# Patient Record
Sex: Female | Born: 1935
Health system: Southern US, Community
[De-identification: ages and names within clinical notes are randomized; demographics above are authoritative.]

## PROBLEM LIST (undated history)

## (undated) ENCOUNTER — Emergency Department (HOSPITAL_BASED_OUTPATIENT_CLINIC_OR_DEPARTMENT_OTHER): Payer: Medicare HMO

## (undated) DIAGNOSIS — M720 Palmar fascial fibromatosis [Dupuytren]: Secondary | ICD-10-CM

## (undated) DIAGNOSIS — Z98811 Dental restoration status: Secondary | ICD-10-CM

## (undated) DIAGNOSIS — I10 Essential (primary) hypertension: Secondary | ICD-10-CM

## (undated) DIAGNOSIS — E785 Hyperlipidemia, unspecified: Secondary | ICD-10-CM

## (undated) HISTORY — DX: Essential (primary) hypertension: I10

## (undated) HISTORY — PX: CATARACT EXTRACTION W/ INTRAOCULAR LENS  IMPLANT, BILATERAL: SHX1307

## (undated) HISTORY — DX: Hyperlipidemia, unspecified: E78.5

---

## 1999-08-05 ENCOUNTER — Encounter: Admission: RE | Admit: 1999-08-05 | Discharge: 1999-08-05 | Payer: Self-pay | Admitting: Obstetrics and Gynecology

## 1999-08-05 ENCOUNTER — Encounter: Payer: Self-pay | Admitting: Obstetrics and Gynecology

## 2000-08-10 ENCOUNTER — Encounter: Admission: RE | Admit: 2000-08-10 | Discharge: 2000-08-10 | Payer: Self-pay | Admitting: Obstetrics and Gynecology

## 2000-08-10 ENCOUNTER — Encounter: Payer: Self-pay | Admitting: Obstetrics and Gynecology

## 2003-09-14 ENCOUNTER — Encounter: Admission: RE | Admit: 2003-09-14 | Discharge: 2003-09-14 | Payer: Self-pay | Admitting: Internal Medicine

## 2003-10-15 ENCOUNTER — Encounter: Admission: RE | Admit: 2003-10-15 | Discharge: 2003-10-15 | Payer: Self-pay | Admitting: Internal Medicine

## 2003-11-26 ENCOUNTER — Encounter: Admission: RE | Admit: 2003-11-26 | Discharge: 2003-11-26 | Payer: Self-pay | Admitting: Internal Medicine

## 2003-12-01 ENCOUNTER — Encounter: Payer: Self-pay | Admitting: Internal Medicine

## 2003-12-22 ENCOUNTER — Encounter: Payer: Self-pay | Admitting: Internal Medicine

## 2004-01-02 ENCOUNTER — Encounter: Payer: Self-pay | Admitting: Internal Medicine

## 2004-09-14 ENCOUNTER — Ambulatory Visit: Payer: Self-pay | Admitting: Internal Medicine

## 2004-10-24 ENCOUNTER — Ambulatory Visit: Payer: Self-pay | Admitting: Internal Medicine

## 2004-11-28 ENCOUNTER — Encounter: Payer: Self-pay | Admitting: Internal Medicine

## 2005-01-24 ENCOUNTER — Ambulatory Visit: Payer: Self-pay | Admitting: Internal Medicine

## 2005-02-20 ENCOUNTER — Ambulatory Visit: Payer: Self-pay | Admitting: Internal Medicine

## 2006-04-16 ENCOUNTER — Ambulatory Visit: Payer: Self-pay | Admitting: Internal Medicine

## 2006-04-16 LAB — CONVERTED CEMR LAB
ALT: 8 units/L (ref 0–40)
AST: 16 units/L (ref 0–37)
Calcium: 9.5 mg/dL (ref 8.4–10.5)
Chloride: 105 meq/L (ref 96–112)
Chol/HDL Ratio, serum: 3.1
Cholesterol: 203 mg/dL (ref 0–200)
GFR calc non Af Amer: 106 mL/min
Glucose, Bld: 87 mg/dL (ref 70–99)
Hemoglobin: 14.9 g/dL (ref 12.0–15.0)
LDL DIRECT: 129.1 mg/dL
RBC: 4.45 M/uL (ref 3.87–5.11)
RDW: 11.2 % — ABNORMAL LOW (ref 11.5–14.6)
TSH: 2.07 microintl units/mL (ref 0.35–5.50)
VLDL: 12 mg/dL (ref 0–40)

## 2006-04-30 ENCOUNTER — Ambulatory Visit: Payer: Self-pay | Admitting: Internal Medicine

## 2006-04-30 LAB — CONVERTED CEMR LAB
GFR calc non Af Amer: 88 mL/min
Glomerular Filtration Rate, Af Am: 107 mL/min/{1.73_m2}
Potassium: 4.2 meq/L (ref 3.5–5.1)
Sodium: 142 meq/L (ref 135–145)

## 2006-09-07 ENCOUNTER — Encounter: Payer: Self-pay | Admitting: Internal Medicine

## 2006-09-07 ENCOUNTER — Encounter: Admission: RE | Admit: 2006-09-07 | Discharge: 2006-09-07 | Payer: Self-pay | Admitting: Internal Medicine

## 2006-09-07 ENCOUNTER — Ambulatory Visit: Payer: Self-pay | Admitting: Internal Medicine

## 2006-09-07 DIAGNOSIS — Z860100 Personal history of colon polyps, unspecified: Secondary | ICD-10-CM | POA: Insufficient documentation

## 2006-09-07 DIAGNOSIS — I10 Essential (primary) hypertension: Secondary | ICD-10-CM | POA: Insufficient documentation

## 2006-09-07 DIAGNOSIS — J439 Emphysema, unspecified: Secondary | ICD-10-CM | POA: Insufficient documentation

## 2006-09-07 DIAGNOSIS — M810 Age-related osteoporosis without current pathological fracture: Secondary | ICD-10-CM | POA: Insufficient documentation

## 2006-09-07 DIAGNOSIS — E785 Hyperlipidemia, unspecified: Secondary | ICD-10-CM | POA: Insufficient documentation

## 2007-05-20 ENCOUNTER — Ambulatory Visit: Payer: Self-pay | Admitting: Internal Medicine

## 2007-09-12 ENCOUNTER — Ambulatory Visit: Payer: Self-pay | Admitting: Internal Medicine

## 2007-09-13 LAB — CONVERTED CEMR LAB
ALT: 9 units/L (ref 0–35)
AST: 21 units/L (ref 0–37)
Basophils Absolute: 0 10*3/uL (ref 0.0–0.1)
Calcium: 10 mg/dL (ref 8.4–10.5)
Cholesterol: 269 mg/dL (ref 0–200)
GFR calc Af Amer: 107 mL/min
GFR calc non Af Amer: 88 mL/min
Glucose, Bld: 94 mg/dL (ref 70–99)
HCT: 41.7 % (ref 36.0–46.0)
HDL: 79.1 mg/dL (ref >39.0)
LDL Cholesterol: 172 mg/dL — ABNORMAL HIGH (ref 0–99)
MCHC: 33.9 g/dL (ref 30.0–36.0)
Monocytes Absolute: 0.4 10*3/uL (ref 0.1–1.0)
Monocytes Relative: 9.9 % (ref 3.0–12.0)
Neutro Abs: 2.1 10*3/uL (ref 1.4–7.7)
Platelets: 291 10*3/uL (ref 150–400)
Potassium: 4.4 meq/L (ref 3.5–5.1)
RDW: 11.9 % (ref 11.5–14.6)
Sodium: 140 meq/L (ref 135–145)
TSH: 2.88 microintl units/mL (ref 0.35–5.50)
Total CHOL/HDL Ratio: 3.4
Triglycerides: 92 mg/dL (ref 0–149)

## 2007-09-15 ENCOUNTER — Encounter (INDEPENDENT_AMBULATORY_CARE_PROVIDER_SITE_OTHER): Payer: Self-pay | Admitting: *Deleted

## 2007-09-20 ENCOUNTER — Encounter (INDEPENDENT_AMBULATORY_CARE_PROVIDER_SITE_OTHER): Payer: Self-pay | Admitting: *Deleted

## 2007-10-16 ENCOUNTER — Ambulatory Visit: Payer: Self-pay | Admitting: Internal Medicine

## 2007-10-16 ENCOUNTER — Encounter: Payer: Self-pay | Admitting: Internal Medicine

## 2007-10-28 ENCOUNTER — Telehealth (INDEPENDENT_AMBULATORY_CARE_PROVIDER_SITE_OTHER): Payer: Self-pay | Admitting: *Deleted

## 2007-11-06 ENCOUNTER — Ambulatory Visit: Payer: Self-pay | Admitting: Internal Medicine

## 2007-11-06 ENCOUNTER — Encounter (INDEPENDENT_AMBULATORY_CARE_PROVIDER_SITE_OTHER): Payer: Self-pay | Admitting: *Deleted

## 2007-11-19 ENCOUNTER — Encounter: Payer: Self-pay | Admitting: Internal Medicine

## 2007-11-29 ENCOUNTER — Telehealth (INDEPENDENT_AMBULATORY_CARE_PROVIDER_SITE_OTHER): Payer: Self-pay | Admitting: *Deleted

## 2008-01-09 ENCOUNTER — Encounter (INDEPENDENT_AMBULATORY_CARE_PROVIDER_SITE_OTHER): Payer: Self-pay | Admitting: *Deleted

## 2008-03-23 ENCOUNTER — Encounter: Payer: Self-pay | Admitting: Internal Medicine

## 2008-03-24 ENCOUNTER — Telehealth (INDEPENDENT_AMBULATORY_CARE_PROVIDER_SITE_OTHER): Payer: Self-pay | Admitting: *Deleted

## 2008-04-08 ENCOUNTER — Ambulatory Visit: Payer: Self-pay | Admitting: Internal Medicine

## 2008-04-14 ENCOUNTER — Telehealth (INDEPENDENT_AMBULATORY_CARE_PROVIDER_SITE_OTHER): Payer: Self-pay | Admitting: *Deleted

## 2008-04-14 LAB — CONVERTED CEMR LAB
AST: 20 units/L (ref 0–37)
Creatinine, Ser: 0.7 mg/dL (ref 0.4–1.2)
HDL: 90.3 mg/dL (ref >39.0)
Potassium: 5 meq/L (ref 3.5–5.1)
Total CHOL/HDL Ratio: 2.8
VLDL: 11 mg/dL (ref 0–40)
Vitamin B-12: 862 pg/mL (ref 211–911)

## 2008-05-04 ENCOUNTER — Telehealth (INDEPENDENT_AMBULATORY_CARE_PROVIDER_SITE_OTHER): Payer: Self-pay | Admitting: *Deleted

## 2008-05-18 ENCOUNTER — Encounter (INDEPENDENT_AMBULATORY_CARE_PROVIDER_SITE_OTHER): Payer: Self-pay | Admitting: *Deleted

## 2008-06-09 ENCOUNTER — Ambulatory Visit: Payer: Self-pay | Admitting: Internal Medicine

## 2009-04-19 ENCOUNTER — Encounter (INDEPENDENT_AMBULATORY_CARE_PROVIDER_SITE_OTHER): Payer: Self-pay | Admitting: *Deleted

## 2009-05-03 ENCOUNTER — Telehealth (INDEPENDENT_AMBULATORY_CARE_PROVIDER_SITE_OTHER): Payer: Self-pay | Admitting: *Deleted

## 2009-05-31 ENCOUNTER — Ambulatory Visit: Payer: Self-pay | Admitting: Internal Medicine

## 2009-05-31 LAB — CONVERTED CEMR LAB: Vit D, 25-Hydroxy: 47 ng/mL (ref 30–89)

## 2009-06-01 LAB — CONVERTED CEMR LAB
ALT: 9 units/L (ref 0–35)
BUN: 11 mg/dL (ref 6–23)
Basophils Absolute: 0 10*3/uL (ref 0.0–0.1)
CO2: 32 meq/L (ref 19–32)
Chloride: 99 meq/L (ref 96–112)
Cholesterol: 254 mg/dL — ABNORMAL HIGH (ref 0–200)
Creatinine, Ser: 0.7 mg/dL (ref 0.4–1.2)
Direct LDL: 154.8 mg/dL
Eosinophils Relative: 2.5 % (ref 0.0–5.0)
HCT: 39.9 % (ref 36.0–46.0)
Hemoglobin: 13.6 g/dL (ref 12.0–15.0)
Lymphocytes Relative: 34.9 % (ref 12.0–46.0)
Lymphs Abs: 1.7 10*3/uL (ref 0.7–4.0)
Monocytes Relative: 11.6 % (ref 3.0–12.0)
Neutro Abs: 2.6 10*3/uL (ref 1.4–7.7)
Platelets: 258 10*3/uL (ref 150.0–400.0)
Potassium: 4.5 meq/L (ref 3.5–5.1)
RDW: 11.8 % (ref 11.5–14.6)
TSH: 3.97 microintl units/mL (ref 0.35–5.50)
Total CHOL/HDL Ratio: 3
VLDL: 16 mg/dL (ref 0.0–40.0)
WBC: 5 10*3/uL (ref 4.5–10.5)

## 2009-06-08 ENCOUNTER — Telehealth (INDEPENDENT_AMBULATORY_CARE_PROVIDER_SITE_OTHER): Payer: Self-pay | Admitting: *Deleted

## 2009-06-15 ENCOUNTER — Ambulatory Visit: Payer: Self-pay | Admitting: Internal Medicine

## 2009-06-15 DIAGNOSIS — F17200 Nicotine dependence, unspecified, uncomplicated: Secondary | ICD-10-CM | POA: Insufficient documentation

## 2009-06-16 ENCOUNTER — Encounter (INDEPENDENT_AMBULATORY_CARE_PROVIDER_SITE_OTHER): Payer: Self-pay | Admitting: *Deleted

## 2009-07-20 ENCOUNTER — Telehealth: Payer: Self-pay | Admitting: Internal Medicine

## 2009-07-23 ENCOUNTER — Encounter (INDEPENDENT_AMBULATORY_CARE_PROVIDER_SITE_OTHER): Payer: Self-pay | Admitting: *Deleted

## 2009-08-19 ENCOUNTER — Encounter (INDEPENDENT_AMBULATORY_CARE_PROVIDER_SITE_OTHER): Payer: Self-pay | Admitting: *Deleted

## 2009-08-25 ENCOUNTER — Telehealth: Payer: Self-pay | Admitting: Internal Medicine

## 2009-08-26 ENCOUNTER — Ambulatory Visit: Payer: Self-pay | Admitting: Internal Medicine

## 2009-09-06 ENCOUNTER — Ambulatory Visit: Payer: Self-pay | Admitting: Internal Medicine

## 2009-09-06 LAB — HM COLONOSCOPY

## 2009-09-08 ENCOUNTER — Encounter: Payer: Self-pay | Admitting: Internal Medicine

## 2009-09-11 ENCOUNTER — Encounter: Payer: Self-pay | Admitting: Internal Medicine

## 2009-09-24 ENCOUNTER — Telehealth: Payer: Self-pay | Admitting: Internal Medicine

## 2009-12-23 ENCOUNTER — Telehealth (INDEPENDENT_AMBULATORY_CARE_PROVIDER_SITE_OTHER): Payer: Self-pay | Admitting: *Deleted

## 2010-02-16 ENCOUNTER — Ambulatory Visit: Payer: Self-pay | Admitting: Family Medicine

## 2010-02-16 ENCOUNTER — Encounter: Payer: Self-pay | Admitting: Internal Medicine

## 2010-06-19 LAB — CONVERTED CEMR LAB
ALT: 10 units/L (ref 0–40)
Basophils Absolute: 0 10*3/uL (ref 0.0–0.1)
Cholesterol: 236 mg/dL (ref 0–200)
Hemoglobin: 13.8 g/dL (ref 12.0–15.0)
Lymphocytes Relative: 33.3 % (ref 12.0–46.0)
MCHC: 34.8 g/dL (ref 30.0–36.0)
Monocytes Absolute: 0.4 10*3/uL (ref 0.2–0.7)
Monocytes Relative: 10.5 % (ref 3.0–11.0)
Neutro Abs: 2.3 10*3/uL (ref 1.4–7.7)
Neutrophils Relative %: 53 % (ref 43.0–77.0)
Potassium: 4.5 meq/L (ref 3.5–5.1)

## 2010-06-21 NOTE — Letter (Signed)
Summary: [facility]   Imported By: Lester [place] 09/14/2009 09:12:49  _____________________________________________________________________  External Attachment:    Type:   Image     Comment:   External Document

## 2010-06-21 NOTE — Letter (Signed)
Summary: Previsit letter  [facility]  [address] [place], [place] [address]   Phone: [phone]  Fax: [phone]       07/23/2009 MRN: [id number]  [facility] [address] [place], [place]  [address]  Dear Christie Peters,  Welcome to the Gastroenterology Division at [facility].    You are scheduled to see a nurse for your pre-procedure visit on 4-4-11at 9am on the 3rd floor at [facility], [address]  We ask that you try to arrive at our office 15 minutes prior to your appointment time to allow for check-in.  Your nurse visit will consist of discussing your medical and surgical history, your immediate family medical history, and your medications.    Please bring a complete list of all your medications or, if you prefer, bring the medication bottles and we will list them.  We will need to be aware of both prescribed and over the counter drugs.  We will need to know exact dosage information as well.  If you are on blood thinners (Coumadin, Plavix, Aggrenox, Ticlid, etc.) please call our office today/prior to your appointment, as we need to consult with your physician about holding your medication.   Please be prepared to read and sign documents such as consent forms, a financial agreement, and acknowledgement forms.  If necessary, and with your consent, a friend or relative is welcome to sit-in on the nurse visit with you.  Please bring your insurance card so that we may make a copy of it.  If your insurance requires a referral to see a specialist, please bring your referral form from your primary care physician.  No co-pay is required for this nurse visit.     If you cannot keep your appointment, please call [phone] to cancel or reschedule prior to your appointment date.  This allows [place] the opportunity to schedule an appointment for another patient in need of care.    Thank you for choosing [facility] for your medical needs.  We  appreciate the opportunity to care for you.  Please visit [place] at our website  to learn more about our practice.                     Sincerely.                                                                                                                   The Gastroenterology Division

## 2010-06-21 NOTE — Progress Notes (Signed)
Summary: fyi - change from actonel to fosamax  Phone Note Call from Patient Call back at [facility] Phone [phone]   Summary of Call: Insurance is requesting pt change from actonel to generic fosamax  Initial call taken by: Shary Decamp,  Sep 24, 2009 2:11 PM  Follow-up for Phone Call        ok as long as patient agreable Elsworth Ledin E. Arzella Rehmann MD  Sep 24, 2009 3:55 PM  yes, I spoke with pt Shary Decamp  Sep 24, 2009 4:00 PM     New/Updated Medications: ALENDRONATE SODIUM 70 MG TABS (ALENDRONATE SODIUM) 1 by mouth qwk Prescriptions: ALENDRONATE SODIUM 70 MG TABS (ALENDRONATE SODIUM) 1 by mouth qwk  #4 x 11   Entered by:   Shary Decamp   Authorized by:   Nolon Rod. Layne Dilauro MD   Signed by:   Shary Decamp on 09/24/2009   Method used:   Electronically to        [facility]  [phone]* (retail)       [address]       [place], [place]  [address]       Ph: [phone] or [phone]       Fax: [phone]   RxID:   [id number]

## 2010-06-21 NOTE — Letter (Signed)
Summary: [facility] Instructions  [facility]  [address] [place], [place] [address]   Phone: [phone]  Fax: [phone]       Christie Peters    [birth year]    MRN: [id number]        Procedure Day /Date:  Monday  09/06/2009     Arrival Time:  8:00 am      Procedure Time:  9:00 am     Location of Procedure:                    _x _  [facility] (4th Floor)                        PREPARATION FOR COLONOSCOPY WITH MOVIPREP   Starting 5 days prior to your procedure _ 4/13/2011_ do not eat nuts, seeds, popcorn, corn, beans, peas,  salads, or any raw vegetables.  Do not take any fiber supplements (e.g. Metamucil, Citrucel, and Benefiber).  THE DAY BEFORE YOUR PROCEDURE         DATE:  09/05/2009  DAY:  Sunday  1.  Drink clear liquids the entire day-NO SOLID FOOD  2.  Do not drink anything colored red or purple.  Avoid juices with pulp.  No orange juice.  3.  Drink at least 64 oz. (8 glasses) of fluid/clear liquids during the day to prevent dehydration and help the prep work efficiently.  CLEAR LIQUIDS INCLUDE: Water Jello Ice Popsicles Tea (sugar ok, no milk/cream) Powdered fruit flavored drinks Coffee (sugar ok, no milk/cream) Gatorade Juice: apple, white grape, white cranberry  Lemonade Clear bullion, consomm, broth Carbonated beverages (any kind) Strained chicken noodle soup Hard Candy                             4.  In the morning, mix first dose of MoviPrep solution:    Empty 1 Pouch A and 1 Pouch B into the disposable container    Add lukewarm drinking water to the top line of the container. Mix to dissolve    Refrigerate (mixed solution should be used within 24 hrs)  5.  Begin drinking the prep at 5:00 p.m. The MoviPrep container is divided by 4 marks.   Every 15 minutes drink the solution down to the next mark (approximately 8 oz) until the full liter is complete.   6.  Follow completed prep with 16 oz of clear liquid of your  choice (Nothing red or purple).  Continue to drink clear liquids until bedtime.  7.  Before going to bed, mix second dose of MoviPrep solution:    Empty 1 Pouch A and 1 Pouch B into the disposable container    Add lukewarm drinking water to the top line of the container. Mix to dissolve    Refrigerate  THE DAY OF YOUR PROCEDURE      DATE:  09/06/2009   DAY:    Monday  Beginning at 4:00a.m. (5 hours before procedure):         1. Every 15 minutes, drink the solution down to the next mark (approx 8 oz) until the full liter is complete.  2. Follow completed prep with 16 oz. of clear liquid of your choice.    3. You may drink clear liquids until 7:00am (2 HOURS BEFORE PROCEDURE).   MEDICATION INSTRUCTIONS     Additional medication instructions: Take only your Benazipril the morning  of your procedure.         OTHER INSTRUCTIONS  You will need a responsible adult at least 73 years of age to accompany you and drive you home.   This person must remain in the waiting room during your procedure.  Wear loose fitting clothing that is easily removed.  Leave jewelry and other valuables at home.  However, you may wish to bring a book to read or  an iPod/MP3 player to listen to music as you wait for your procedure to start.  Remove all body piercing jewelry and leave at home.  Total time from sign-in until discharge is approximately 2-3 hours.  You should go home directly after your procedure and rest.  You can resume normal activities the  day after your procedure.  The day of your procedure you should not:   Drive   Make legal decisions   Operate machinery   Drink alcohol   Return to work  You will receive specific instructions about eating, activities and medications before you leave.    The above instructions have been reviewed and explained to me by   Wyona Almas RN  April  73, 2011 10:56 AM     I fully understand and can verbalize these instructions  _____________________________ Date _________

## 2010-06-21 NOTE — Letter (Signed)
Summary: [facility]   Imported By: Lester Gruver 09/14/2009 09:15:04  _____________________________________________________________________  External Attachment:    Type:   Image     Comment:   External Document

## 2010-06-21 NOTE — Progress Notes (Signed)
Summary: refill  Phone Note Refill Request Message from:  Fax from Pharmacy on December 23, 2009 9:05 AM  Refills Requested: Medication #1:  HYDROCHLOROTHIAZIDE 25 MG TABS Take 1 tablet by mouth once a day  Medication #2:  BENAZEPRIL HCL 20 MG  TABS 2 by mouth qd [facility] - fax [phone]  Initial call taken by: Okey Regal [place],  December 23, 2009 9:06 AM    Prescriptions: BENAZEPRIL HCL 20 MG  TABS (BENAZEPRIL HCL) 2 by mouth qd  #60 Each x 6   Entered by:   Army Fossa CMA   Authorized by:   Nolon Rod. Paz MD   Signed by:   Army Fossa CMA on 12/23/2009   Method used:   Electronically to        [facility]  [phone]* (retail)       [address]       [place], [place]  [address]       Ph: [phone] or [phone]       Fax: [phone]   RxID:   [id number] HYDROCHLOROTHIAZIDE 25 MG TABS (HYDROCHLOROTHIAZIDE) Take 1 tablet by mouth once a day  #30 Each x 6   Entered by:   Army Fossa CMA   Authorized by:   Nolon Rod. Paz MD   Signed by:   Army Fossa CMA on 12/23/2009   Method used:   Electronically to        [facility]  [phone]* (retail)       [address]       [place], [place]  [address]       Ph: [phone] or [phone]       Fax: [phone]   RxID:   [id number]

## 2010-06-21 NOTE — Letter (Signed)
Summary: Primary Care Consult Scheduled Letter  [facility]  [address] [place], [place] [address]   Phone: [phone]  Fax: [phone]      06/16/2009 MRN: [id number]  [facility] [address] [place], [place]  [address]    Dear Mrs. Record,    We have scheduled an appointment for you.  At the recommendation of Dr. Willow Ora, we have scheduled you for a Screening Mammogram with [facility] on 09-06-2009 at 9:00am.  Their address is [address], [address], [place] [address]. The office phone number is [phone].  If this appointment day and time is not convenient for you, please feel free to call the office of the doctor you are being referred to at the number listed above and reschedule the appointment.    It is important for you to keep your scheduled appointments. We are here to make sure you are given good patient care.   Thank you,    Renee, Patient Care Coordinator [facility]

## 2010-06-21 NOTE — Procedures (Signed)
Summary: Colonoscopy  Patient: Christie Peters Note: All result statuses are Final unless otherwise noted.  Tests: (1) Colonoscopy (COL)   COL Colonoscopy           DONE     [facility]     [address]     [place], [place]  [address]           COLONOSCOPY PROCEDURE REPORT           PATIENT:  Christie Peters, Christie Peters  MR#:  [id number]     BIRTHDATE:  Sep 11, 1935, 73 yrs. old  GENDER:  female     ENDOSCOPIST:  Iva Boop, MD, [facility]     REF. BY:  Willow Ora, M.D.     PROCEDURE DATE:  09/06/2009     PROCEDURE:  Colonoscopy with biopsy and snare polypectomy     ASA CLASS:  Class II     INDICATIONS:  surveillance and high-risk screening, history of     polyps patient reported colon polypectomy about 5 years ago     ([place])     MEDICATIONS:   Fentanyl 75 mcg IV, Versed 7 mg IV           DESCRIPTION OF PROCEDURE:   After the risks benefits and     alternatives of the procedure were thoroughly explained, informed     consent was obtained.  Digital rectal exam was performed and     revealed no abnormalities.   The LB [id number]-[id number] endoscope     was introduced through the anus and advanced to the cecum, which     was identified by both the appendix and ileocecal valve, without     limitations.  The quality of the prep was excellent, using     MoviPrep.  The instrument was then slowly withdrawn as the colon     was fully examined. Insertion: 7:16 minutes Withdrawal: 21:09     minutes     <<PROCEDUREIMAGES>>           FINDINGS:  There were multiple polyps identified and removed.     Eight (8) polyps removed. Largest 7-8 mm and smallest 2 mm.     Ascending (1), transverse (3), descending (2) and rectum (2). The     polyps were removed using cold biopsy forceps and were snared     without cautery. Retrieval was successful for all.  Moderate     diverticulosis was found in the left colon.  This was otherwise a     normal examination of the colon.   Retroflexed views in the  rectum     revealed internal hemorrhoids.    The scope was then withdrawn     from the patient and the procedure completed.           COMPLICATIONS:  None     ENDOSCOPIC IMPRESSION:     1) Polyps, multiple (8 removed, largest 7-43mm)     2) Moderate diverticulosis in the left colon     3) Internal hemorrhoids     4) Otherwise normal examination, excellent prep           5) Prior colon polypectomy reported about 5 years ago           RECOMMENDATIONS:     She indicated that records were requested - have not seen yet.     Will look again for these.           REPEAT EXAM:  In for Colonoscopy,  pending biopsy results.           Iva Boop, MD, [facility]           CC:  Willow Ora, MD     The Patient           n.     eSIGNED:   Iva Boop at 09/06/2009 10:06 AM           Marigene Ehlers, [id number]  Note: An exclamation mark (!) indicates a result that was not dispersed into the flowsheet. Document Creation Date: 09/06/2009 10:07 AM _______________________________________________________________________  (1) Order result status: Final Collection or observation date-time: 09/06/2009 09:49 Requested date-time:  Receipt date-time:  Reported date-time:  Referring Physician:   Ordering Physician: Stan Head [phone]) Specimen Source:  Source: Launa Grill Order Number: [id number] Lab site:   Appended Document: Colonoscopy   Colonoscopy  Procedure date:  09/06/2009  Findings:      1) Polyps, multiple (8 removed, largest 7-24mm) ADENOMAS AND 1 HYPERPLASTIC     2) Moderate diverticulosis in the left colon     3) Internal hemorrhoids     4) Otherwise normal examination, excellent prep           5) Prior colon polypectomy reported about 5 years ago ADENOMA diminutive  Comments:      Repeat colonoscopy in 3 years.     Procedures Next Due Date:    Colonoscopy: 09/2012   Appended Document: Colonoscopy     Procedures Next Due Date:    Colonoscopy: 09/2012

## 2010-06-21 NOTE — Progress Notes (Signed)
Summary: clearance for surgery  Phone Note From Other Clinic Call back at fax [phone], phone [phone]   Caller: [facility] Details for Reason: NEEDS SURGICAL CLEARANCE Summary of Call: PROCEDURE: superficial keratectomy OS Patient has been requested to stop aspirin 10 days prior to surgery Type of anesthesia: local MAC  Current Meds:  HYDROCHLOROTHIAZIDE 25 MG TABS (HYDROCHLOROTHIAZIDE) Take 1 tablet by mouth once a day ACTONEL 150 MG TABS (RISEDRONATE SODIUM) 1 by mouth q month BENAZEPRIL HCL 20 MG  TABS (BENAZEPRIL HCL) 2 by mouth qd * ASA, CAL, D, MVI  PRAVACHOL 40 MG TABS (PRAVASTATIN SODIUM) one by mouth nightly  PMH COPD Hyperlipidemia Hypertension Osteoporosis  Last ov - 06/15/09 (cpx), no pending appt  PATIENT IS CLEAR FOR SURGERY, OK TO HOLD ASA TEMPORARILY IF NEEDED Cecilia Vancleve E. Avaline Stillson MD  August 25, 2009 9:13 AM     Follow-up for Phone Call        faxed Follow-up by: Shary Decamp,  August 25, 2009 9:20 AM

## 2010-06-21 NOTE — Assessment & Plan Note (Signed)
Summary: yearly ,WANTS PAP/RH......   Vital Signs:  Patient profile:   75 year old Peters Menstrual status:  postmenopausal Height:      67 inches Weight:      125.8 pounds BMI:     19.77 Temp:     98.3 degrees F Pulse rate:   70 / minute Pulse rhythm:   regular BP sitting:   110 / 70  (left arm) Cuff size:   regular  Vitals Entered By: Shary Decamp (June 15, 2009 2:10 PM) CC: yearly - ? pap Is Patient Diabetic? No Comments SICK X 1 WEEK:  - achy, nausea, feels like a "wet noodle"  - pt started on zpak last week & is feeling A LOT better  - pt states she has not been taking zocor or actonel x long time  - pt has never had pneumovax, would like to discuss with MD  - pt will check with insurance company about shingles vaccine Shary Decamp  June 15, 2009 2:15 PM      Menstrual Status postmenopausal Last PAP Result normal   History of Present Illness: acute illness: symptoms started a week ago: fever, achy, nausea, feels like a "wet noodle" self Rx a zpak last week & is feeling A LOT better  Hyperlipidemia--not taking Zocor for a while, no pain but "I felt is not doing me any good,  loosing muscle tone"  Hypertension-- ambulatory BPs good  Osteoporosis--not taking Actonel for a while , no particular reason to do that   yearly checkup, chart is reviewed        Preventive Screening-Counseling & Management  Alcohol-Tobacco     Alcohol drinks/day: socially     Smoking Status: current     Packs/Day: 0.5  Caffeine-Diet-Exercise     Caffeine use/day: no      Does Patient Exercise: no  Current Medications (verified): 1)  Hydrochlorothiazide 25 Mg Tabs (Hydrochlorothiazide) .... Take 1 Tablet By Mouth Once A Day 2)  Benazepril Hcl 20 Mg  Tabs (Benazepril Hcl) .... 2 By Mouth Qd 3)  Asa, Cal, D, Mvi  Allergies (verified): No Known Drug Allergies  Past History:  Past Medical History: COPD Hyperlipidemia Hypertension Osteoporosis COLONOSCOPY, HX OF  (ICD-V12.79)  Past Surgical History: Reviewed history from 09/07/2006 and no changes required. Caesarean section  Family History: CABG--brother DM--brother breast cancer--sister colon cancer--no M - deceased stroke (age 89) F - deceased age 55, tonsil operation  Social History: Reviewed history from 09/12/2007 and no changes required. Married 4 children, one is deceased. original from [place] tobacco-- < 1ppd ETOH-- socially exercise--no diet--  she is careful w/ diet will spend 2 months in [place] this winter Packs/Day:  0.5 Caffeine use/day:  no  Does Patient Exercise:  no  Review of Systems General:  Denies fatigue and weight loss. CV:  Denies chest pain or discomfort and shortness of breath with exertion; no claudication. Resp:  Denies coughing up blood and wheezing. GI:  Denies bloody stools, diarrhea, nausea, and vomiting. GU:  no vag d/c or spotting does SBE, it is normal . Psych:  Denies anxiety and depression.  Physical Exam  General:  alert, well-developed, and well-nourished.   Neck:  no masses, no thyromegaly, and normal carotid upstroke.   Breasts:  No mass, nodules, thickening, tenderness, bulging, retraction, inflamation, nipple discharge or skin changes noted.  no LADs Lungs:  normal respiratory effort, no intercostal retractions, and no accessory muscle use.  decreased but clear BS Heart:  normal rate, regular rhythm,  and no murmur.   Abdomen:  soft, non-tender, no distention, no masses, no guarding, and no rigidity.  no bruit Pulses:  normal Peters pulses bilaterally Extremities:   no edema, multiple varicosities noted Psych:  Oriented X3, memory intact for recent and remote, normally interactive, good eye contact, not anxious appearing, and not depressed appearing.     Impression & Recommendations:  Problem # 1:  OSTEOPOROSIS (ICD-733.00) last bone density test was 09/2007 was recommended calcium, vitamin D, Actonel as her T score was very close  to osteoporosis plan: restatr actonel Her updated medication list for this problem includes:    Actonel 150 Mg Tabs (Risedronate sodium) .Marland Kitchen... 1 by mouth q month  Orders: Prescription Created Electronically [phone])  Problem # 2:  HEALTH MAINTENANCE EXAM (ICD-V70.0) Td 2008 had a flu shot pneumonia shot 11-07 interested in the shingles shot, will let us know when ready  colonoscopy in [place] ( per pt) in 2005, polyps x 5. Was told to repeat in 5 years Hemoccult neg 10/2007 plan:   refer to GI  used to see GYN last PAP 2010 @ gyn, no h/o abnormal PAPs,  married x 50 years, we agreed to have next PAP 2013 FH breast ca last MMG 11-09, ordering one  breast exam today neg  counseled: high risk for CV events, recommend quit tobacco, treat her hyperlipidemia, exercise more all labs discussed  Orders: Gastroenterology Referral (GI) Radiology Referral (Radiology)  Problem # 3:  TOBACCO ABUSE (ICD-305.1) counseled about risks and quitting help available to her printed material provided regards quitting   Orders: Tobacco use cessation intermediate 3-10 minutes ([address])  Problem # 4:  HYPERTENSION (ICD-401.9) at goal  Her updated medication list for this problem includes:    Hydrochlorothiazide 25 Mg Tabs (Hydrochlorothiazide) .Marland Kitchen... Take 1 tablet by mouth once a day    Benazepril Hcl 20 Mg Tabs (Benazepril hcl) .Marland Kitchen... 2 by mouth qd  BP today: 110/70 Prior BP: 122/80 (06/09/2008)  Labs Reviewed: K+: 4.5 (05/31/2009) Creat: : 0.7 (05/31/2009)   Chol: 254 (05/31/2009)   HDL: 83.30 (05/31/2009)   LDL: DEL (04/08/2008)   TG: 80.0 (05/31/2009)  Orders: Prescription Created Electronically [id number])  Problem # 5:  HYPERLIPIDEMIA (ICD-272.4) see HPI, having side effects from Zocor? Recommend trial with Pravachol instead of zocor The following medications were removed from the medication list:    Zocor 40 Mg Tabs (Simvastatin) .Marland Kitchen... Take one tablet every evening at bedtime. Her updated  medication list for this problem includes:    Pravachol 40 Mg Tabs (Pravastatin sodium) ..... One by mouth nightly  Labs Reviewed: SGOT: 20 (05/31/2009)   SGPT: 9 (05/31/2009)   HDL:83.30 (05/31/2009), 90.3 (04/08/2008)  LDL:DEL (04/08/2008), 172 (09/12/2007)  Chol:254 (05/31/2009), 257 (04/08/2008)  Trig:80.0 (05/31/2009), 57 (04/08/2008)  Problem # 6:  COPD (ICD-496)  patient is nearly asymptomatic from COPD PFTs  Orders: Pulmonary Referral (Pulmonary)  Problem # 7:  all tests need to be done in April as she is going to [place] soon  Complete Medication List: 1)  Hydrochlorothiazide 25 Mg Tabs (Hydrochlorothiazide) .... Take 1 tablet by mouth once a day 2)  Actonel 150 Mg Tabs (Risedronate sodium) .Marland Kitchen.. 1 by mouth q month 3)  Benazepril Hcl 20 Mg Tabs (Benazepril hcl) .... 2 by mouth qd 4)  Asa, Cal, D, Mvi  5)  Pravachol 40 Mg Tabs (Pravastatin sodium) .... One by mouth nightly  Patient Instructions: 1)  Please schedule a follow-up appointment in April ,  fasting  Prescriptions: HYDROCHLOROTHIAZIDE  25 MG TABS (HYDROCHLOROTHIAZIDE) Take 1 tablet by mouth once a day  #30 Each x 6   Entered and Authorized by:   Elita Quick E. Currie Dennin MD   Signed by:   Nolon Rod. Artis Buechele MD on 06/15/2009   Method used:   Electronically to        [facility]  [phone]* (retail)       [address]       [place], [place]  [address]       Ph: [phone] or [phone]       Fax: [phone]   RxID:   [id number] ACTONEL 150 MG TABS (RISEDRONATE SODIUM) 1 by mouth q month  #3 x 4   Entered and Authorized by:   Nolon Rod. Sanae Willetts MD   Signed by:   Nolon Rod. Waylen Depaolo MD on 06/15/2009   Method used:   Electronically to        [facility]  [phone]* (retail)       [address]       [place], [place]  [address]       Ph: [phone] or [phone]       Fax: [phone]   RxID:   [id number] BENAZEPRIL HCL 20 MG  TABS (BENAZEPRIL HCL) 2 by mouth  qd  #60 Each x 6   Entered and Authorized by:   Nolon Rod. Shaquanna Lycan MD   Signed by:   Nolon Rod. Quisha Mabie MD on 06/15/2009   Method used:   Electronically to        [facility]  [phone]* (retail)       [address]       [place], [place]  [address]       Ph: [phone] or [phone]       Fax: [phone]   RxID:   [id number] PRAVACHOL 40 MG TABS (PRAVASTATIN SODIUM) one by mouth nightly  #30 x 3   Entered and Authorized by:   Nolon Rod. Elleah Hemsley MD   Signed by:   Nolon Rod. Salman Wellen MD on 06/15/2009   Method used:   Electronically to        [facility]  [phone]* (retail)       [address]       [place], [place]  [address]       Ph: [phone] or [phone]       Fax: [phone]   RxID:   [id number]    Preventive Care Screening  Bone Density:    Date:  10/16/2007    Next Due:  10/2009    Results:  osteopenia std dev  Mammogram:    Date:  03/23/2008    Next Due:  03/2009    Results:  normal   Pap Smear:    Date:  05/22/2006    Results:  normal   Colonoscopy:    Date:  05/22/2004    Results:  polyps - done in [place]     Immunization History:  Influenza Immunization History:    Influenza:  historical (02/19/2009)

## 2010-06-21 NOTE — Letter (Signed)
Summary: Primary Care Consult Scheduled Letter  [facility] [place]  [address] [place], [place] [address]   Phone: [phone]  Fax: [phone]      07/23/2009 MRN: [id number]  [facility] [address] [place], [place]  [address]    Dear Ms. Arpin,    We have scheduled an appointment for you.  At the recommendation of Dr. Willow Ora, we have scheduled you for a Screening Colonosocopy with with [facility].  Your initial Pre-Visit with the Nurse is on 08-23-2009 at 9:00am.  Your Screening Colonoscopy is on 09-06-2009 arrive no later than 8:00am with Dr. Stan Head.  Their address is [address], [place] [address]. The office phone number is [phone].  If this appointment day and time is not convenient for you, please feel free to call the office of the doctor you are being referred to at the number listed above and reschedule the appointment.    It is important for you to keep your scheduled appointments. We are here to make sure you are given good patient care.   Thank you,    Renee, Patient Care Coordinator [place] at [facility]

## 2010-06-21 NOTE — Progress Notes (Signed)
Summary: FAILED PFT REFERRAL  Phone Note Other Incoming   Caller: [facility] Summary of Call: IN REF TO PFT REFERRAL, PER PHONE CALL FROM [facility], PT CALLED & CXLED HER APPT IN APRIL & DID NOT RESCHEDULE.  I HAVE LEFT MESSAGE FOR PT TO CALL ME ABOUT RESCHEDULING. Initial call taken by: Magdalen Spatz [facility],  July 20, 2009 10:08 AM  Follow-up for Phone Call        noted Follow-up by: [facility] E. Soul Deveney MD,  July 21, 2009 8:42 AM

## 2010-06-21 NOTE — Letter (Signed)
Summary: Patient Notice- Polyp Results  [facility]  [address] [place], [place] [address]   Phone: [phone]  Fax: [phone]        September 11, 2009 MRN: [id number]    [facility] [address] [place], [place]  [address]    Dear Ms. Flock,  The polyps removed from your colon were adenomatous. This means that they were pre-cancerous or that  they had the potential to change into cancer over time.   I recommend that you have a repeat colonoscopy in 3 years to determine if you have developed any new polyps over time. If you develop any new rectal bleeding, abdominal pain or significant bowel habit changes, please contact us before then.   I did review the records of your last colonoscopy, you had a small polyp then.  In addition to repeating colonoscopy, changing health habits may reduce your risk of having more colon polyps and possibly, colon cancer. You may lower your risk of future polyps and colon cancer by adopting healthy habits such as not smoking or using tobacco (if you do), being physically active, losing weight (if overweight), and eating a diet which includes fruits and vegetables and limits red meat.  Please call us if you are having persistent problems or have questions about your condition that have not been fully answered at this time.  Sincerely,  Iva Boop MD, [facility]  This letter has been electronically signed by your physician.  Appended Document: Patient Notice- Polyp Results letter mailed 4.25.11

## 2010-06-21 NOTE — Letter (Signed)
Summary: Primary Care Consult Scheduled Letter  [facility] [place]  [address] [place], [place] [address]   Phone: [phone]  Fax: [phone]      06/16/2009 MRN: [id number]  [facility] [address] [place], [place]  [address]    Dear Mrs. Vialpando,    We have scheduled an appointment for you.  At the recommendation of Dr. Willow Ora, we have scheduled you for Pulmonary Function Test with [facility] on 08-23-2009 at 3:00pm.  Their address is [address]nd floor, [place] [address]. The office phone number is [phone].  If this appointment day and time is not convenient for you, please feel free to call the office of the doctor you are being referred to at the number listed above and reschedule the appointment.    It is important for you to keep your scheduled appointments. We are here to make sure you are given good patient care.  Thank you,    Renee, Patient Care Coordinator [place] at [facility]

## 2010-06-21 NOTE — Progress Notes (Signed)
  Phone Note Call from Patient   Caller: Patient Summary of Call: pt called c/o cold, fever cough, wants a ZPak, offered appt today pt refused says I dont feel like coming in wants abx.  Explained to pt will need to be seen before abx.  Dr will need to know what he is treating. Last ov 06/09/08, pt says I have appt next week will just wait until then.  Pt was informed to give [place] a call if sx increase will work in for appt.Kandice Hams  June 08, 2009 11:52 AM   Initial call taken by: Kandice Hams,  June 08, 2009 11:52 AM

## 2010-06-21 NOTE — Miscellaneous (Signed)
Summary: BONE DENSITY  Clinical Lists Changes  Orders: Added new Test order of T-Bone Densitometry (770[address]) - Signed Added new Test order of T-Lumbar Vertebral Assessment (77082) - Signed 

## 2010-06-21 NOTE — Procedures (Signed)
Summary: [facility] Previsit/prep  Clinical Lists Changes  Medications: Added new medication of MOVIPREP 100 GM  SOLR (PEG-KCL-NACL-NASULF-NA ASC-C) As per prep instructions. - Signed Rx of MOVIPREP 100 GM  SOLR (PEG-KCL-NACL-NASULF-NA ASC-C) As per prep instructions.;  #1 x 0;  Signed;  Entered by: Wyona Almas RN;  Authorized by: Iva Boop MD, [facility];  Method used: Electronically to [facility]  [phone]*, [address] [place], [place], [place]  [address], Ph: [phone] or [phone], Fax: [phone] Observations: Added new observation of NKA: T (08/26/2009 10:29)    Prescriptions: MOVIPREP 100 GM  SOLR (PEG-KCL-NACL-NASULF-NA ASC-C) As per prep instructions.  #1 x 0   Entered by:   Wyona Almas RN   Authorized by:   Iva Boop MD, [facility]   Signed by:   Wyona Almas RN on 08/26/2009   Method used:   Electronically to        [facility]  [phone]* (retail)       [address]       [place], [place]  [address]       Ph: [phone] or [phone]       Fax: [phone]   RxID:   [id number]

## 2010-08-26 ENCOUNTER — Other Ambulatory Visit: Payer: Self-pay | Admitting: Internal Medicine

## 2010-08-29 ENCOUNTER — Other Ambulatory Visit: Payer: Self-pay | Admitting: *Deleted

## 2010-08-29 MED ORDER — HYDROCHLOROTHIAZIDE 25 MG PO TABS: 25.0000 mg | ORAL_TABLET | Freq: Every day | ORAL | Status: DC

## 2010-08-29 MED ORDER — BENAZEPRIL HCL 20 MG PO TABS: 20.0000 mg | ORAL_TABLET | Freq: Every day | ORAL | Status: DC

## 2010-10-05 ENCOUNTER — Encounter: Payer: Self-pay | Admitting: Internal Medicine

## 2010-10-07 ENCOUNTER — Other Ambulatory Visit: Payer: Self-pay | Admitting: Internal Medicine

## 2010-10-11 ENCOUNTER — Other Ambulatory Visit: Payer: Self-pay | Admitting: Internal Medicine

## 2010-10-12 ENCOUNTER — Other Ambulatory Visit: Payer: Self-pay | Admitting: Dermatology

## 2010-10-18 ENCOUNTER — Encounter: Payer: Self-pay | Admitting: Internal Medicine

## 2010-10-18 ENCOUNTER — Ambulatory Visit (INDEPENDENT_AMBULATORY_CARE_PROVIDER_SITE_OTHER): Payer: Medicare Other | Admitting: Internal Medicine

## 2010-10-18 DIAGNOSIS — F17200 Nicotine dependence, unspecified, uncomplicated: Secondary | ICD-10-CM

## 2010-10-18 DIAGNOSIS — I739 Peripheral vascular disease, unspecified: Secondary | ICD-10-CM | POA: Insufficient documentation

## 2010-10-18 DIAGNOSIS — D239 Other benign neoplasm of skin, unspecified: Secondary | ICD-10-CM

## 2010-10-18 DIAGNOSIS — I8390 Asymptomatic varicose veins of unspecified lower extremity: Secondary | ICD-10-CM

## 2010-10-18 DIAGNOSIS — R799 Abnormal finding of blood chemistry, unspecified: Secondary | ICD-10-CM

## 2010-10-18 DIAGNOSIS — Z0000 Encounter for general adult medical examination without abnormal findings: Secondary | ICD-10-CM | POA: Insufficient documentation

## 2010-10-18 DIAGNOSIS — E785 Hyperlipidemia, unspecified: Secondary | ICD-10-CM

## 2010-10-18 DIAGNOSIS — I10 Essential (primary) hypertension: Secondary | ICD-10-CM

## 2010-10-18 DIAGNOSIS — M810 Age-related osteoporosis without current pathological fracture: Secondary | ICD-10-CM

## 2010-10-18 LAB — LIPID PANEL
Cholesterol: 244 mg/dL — ABNORMAL HIGH (ref 0–200)
HDL: 100.2 mg/dL (ref >39.00)
VLDL: 10 mg/dL (ref 0.0–40.0)

## 2010-10-18 LAB — TSH: TSH: 3.12 u[IU]/mL (ref 0.35–5.50)

## 2010-10-18 LAB — BASIC METABOLIC PANEL
Calcium: 9.5 mg/dL (ref 8.4–10.5)
GFR: 112.93 mL/min (ref >60.00)
Potassium: 4.3 mEq/L (ref 3.5–5.1)
Sodium: 140 mEq/L (ref 135–145)

## 2010-10-18 LAB — CBC WITH DIFFERENTIAL/PLATELET
Basophils Relative: 0.9 % (ref 0.0–3.0)
Eosinophils Absolute: 0.1 10*3/uL (ref 0.0–0.7)
Eosinophils Relative: 1.9 % (ref 0.0–5.0)
HCT: 40.4 % (ref 36.0–46.0)
Lymphs Abs: 1.5 10*3/uL (ref 0.7–4.0)
MCHC: 34.1 g/dL (ref 30.0–36.0)
MCV: 101.2 fl — ABNORMAL HIGH (ref 78.0–100.0)
Monocytes Absolute: 0.6 10*3/uL (ref 0.1–1.0)
Neutrophils Relative %: 51 % (ref 43.0–77.0)
Platelets: 262 10*3/uL (ref 150.0–400.0)
WBC: 4.5 10*3/uL (ref 4.5–10.5)

## 2010-10-18 LAB — LDL CHOLESTEROL, DIRECT: Direct LDL: 152.2 mg/dL

## 2010-10-18 MED ORDER — ZOSTER VACCINE LIVE 19400 UNT/0.65ML ~~LOC~~ SOLR: 0.6500 mL | Freq: Once | SUBCUTANEOUS | Status: AC

## 2010-10-18 MED ORDER — BENAZEPRIL HCL 20 MG PO TABS: 40.0000 mg | ORAL_TABLET | Freq: Every day | ORAL | Status: DC

## 2010-10-18 NOTE — Progress Notes (Signed)
  Subjective:    Patient ID: Christie Peters, female    DOB: 12/06/1935, 75 y.o.   MRN: [id number]  HPI  Here for Medicare AWV:  1. Risk factors based on Past M, S, F history: reviewed 2. Physical Activities:  Active, no routine exercise  3. Depression/mood:  No problems noted or reported  4. Hearing: no problems noted but states has hearing aids and does not use it , sees hearing doctor routinely    5. ADL's:  Independent  6. Fall Risk: low risk, no recent falls  7. home Safety: does feelsafe at home  8. Height, weight, &visual acuity: see VS,  Still have some problems, sees ophthalmology regulalrly ([facility])  9. Counseling: provided 10. Labs ordered based on risk factors: if needed  11. Referral Coordination: if needed 12.  Care Plan, see assessment and plan  13.   Cognitive Assessment: cognition and motor skills wnl   In addition, today we discussed the following: Varicose veins: long h/o such, c/o edema , would like something done about V. Veins. HTN--Good med compliance, no amb BPs Osteopenia--  Good med compliance w/ meds, ca and vit d  Past Medical History  Diagnosis Date  . COPD (chronic obstructive pulmonary disease)   . Hyperlipidemia   . Hypertension   . Osteoporosis    Past Surgical History  Procedure Date  . Cesarean section     Family History: CABG--brother DM--brother breast cancer--sister colon cancer--no M - deceased stroke (age 5) F - deceased age 20, tonsil operation  Social History: Married 4 children, one is deceased. original from [place] tobacco-- < 1ppd ETOH-- socially diet--  Regular  will spend 2 months in [place] this winter      Review of Systems  Constitutional: Negative for fever and fatigue.  Respiratory: Negative for cough, shortness of breath and wheezing.   Cardiovascular: Negative for chest pain and leg swelling.  Gastrointestinal: Negative for abdominal pain and blood in stool.  Genitourinary: Negative for dysuria and  hematuria.       Does not do SBE       Objective:   Physical Exam  Constitutional: She is oriented to person, place, and time. She appears well-developed. No distress.  HENT:  Head: Normocephalic and atraumatic.  Neck: No thyromegaly present.       Carotid pulses normal  Cardiovascular: Normal rate, regular rhythm and normal heart sounds.   No murmur heard. Pulmonary/Chest: Effort normal and breath sounds normal. No respiratory distress. She has no wheezes. She has no rales.  Abdominal: Soft. She exhibits no distension. There is no tenderness. There is no rebound and no guarding.  Genitourinary:       Breast without dominant mass, no axillary lymphadenopathy    Musculoskeletal: She exhibits no edema.  Neurological: She is alert and oriented to person, place, and time.  Skin: Skin is warm and dry. She is not diaphoretic.       Prominent bilateral lower extremity varicose veins without phlebitis.  Psychiatric: She has a normal mood and affect. Her behavior is normal. Judgment and thought content normal.        Assessment & Plan:

## 2010-10-18 NOTE — Assessment & Plan Note (Signed)
Counseled, risk discussed!

## 2010-10-18 NOTE — Assessment & Plan Note (Signed)
C/o edema , likes something done: refer to radiology

## 2010-10-18 NOTE — Assessment & Plan Note (Addendum)
Td 2008 pneumonia shot 11-07 interested in the shingles shot, Rx provided  colonoscopy in [place] ( per pt) in 2005, polyps x 5.  cscope 08-2009------>  next 2014 used to see GYN, last PAP 2010 @ gyn, no h/o abnormal PAPs,  married x 50 years, we agreed to revisit the issue in  2013 FH breast ca, last MMG  08-2009, ordering one  breast exam today neg  Counseled about a healthy life style

## 2010-10-18 NOTE — Assessment & Plan Note (Addendum)
bone density test was 09/2007 and 01-2010, T score -2.1, on meds . Labs

## 2010-10-18 NOTE — Assessment & Plan Note (Signed)
On diet only, labs 

## 2010-10-18 NOTE — Assessment & Plan Note (Signed)
Sees derm routinely

## 2010-10-18 NOTE — Assessment & Plan Note (Signed)
At goal, labs  

## 2010-10-18 NOTE — Assessment & Plan Note (Signed)
H/o increased MCV , chronically, normal B12 in the past

## 2010-10-19 ENCOUNTER — Telehealth: Payer: Self-pay | Admitting: *Deleted

## 2010-10-19 ENCOUNTER — Other Ambulatory Visit: Payer: Self-pay | Admitting: Internal Medicine

## 2010-10-19 DIAGNOSIS — I83899 Varicose veins of unspecified lower extremities with other complications: Secondary | ICD-10-CM

## 2010-10-19 DIAGNOSIS — Z1231 Encounter for screening mammogram for malignant neoplasm of breast: Secondary | ICD-10-CM

## 2010-10-19 DIAGNOSIS — I8390 Asymptomatic varicose veins of unspecified lower extremity: Secondary | ICD-10-CM

## 2010-10-19 NOTE — Telephone Encounter (Signed)
Message copied by Leanne Lovely on Wed Oct 19, 2010  8:52 AM ------      Message from: Willow Ora E      Created: Tue Oct 18, 2010  5:18 PM       PLEASE ENTER A MMG Dx  V 76.12      ALSO A "INTERVENTIONAL RADIOLOGY REFERAL" DX VARICOSE VEINS

## 2010-10-21 ENCOUNTER — Telehealth: Payer: Self-pay | Admitting: *Deleted

## 2010-10-21 MED ORDER — PRAVASTATIN SODIUM 40 MG PO TABS: 40.0000 mg | ORAL_TABLET | Freq: Every evening | ORAL | Status: DC

## 2010-10-21 NOTE — Telephone Encounter (Signed)
I spoke w/ pt she is aware, she will call for lab appt.

## 2010-10-21 NOTE — Telephone Encounter (Signed)
Message left for patient to return my call.  

## 2010-10-21 NOTE — Telephone Encounter (Signed)
Message copied by Leanne Lovely on Fri Oct 21, 2010  9:48 AM ------      Message from: Willow Ora E      Created: Thu Oct 20, 2010  6:00 PM       (MCV chronically elevated, B12 normal in the past)      Advise patient:      Cholesterol continue to be elevated, in the past she tried Pravachol , I think she needs to go back on 40 mg daily on that she had side effects.      Please call the prescription, FLP-AST-ALT in 6 weeks---dx  Hyperlipidemia      Other labs wnl

## 2010-10-26 ENCOUNTER — Ambulatory Visit
Admission: RE | Admit: 2010-10-26 | Discharge: 2010-10-26 | Disposition: A | Discharge status: Home / Self Care | Payer: Medicare Other | Source: Ambulatory Visit | Attending: Internal Medicine | Admitting: Internal Medicine

## 2010-10-26 DIAGNOSIS — I83899 Varicose veins of unspecified lower extremities with other complications: Secondary | ICD-10-CM

## 2010-11-01 ENCOUNTER — Other Ambulatory Visit: Payer: Self-pay | Admitting: Internal Medicine

## 2010-11-01 MED ORDER — BENAZEPRIL HCL 20 MG PO TABS: 40.0000 mg | ORAL_TABLET | Freq: Every day | ORAL | Status: DC

## 2010-11-07 ENCOUNTER — Ambulatory Visit: Payer: Medicare Other

## 2010-11-14 ENCOUNTER — Ambulatory Visit
Admission: RE | Admit: 2010-11-14 | Discharge: 2010-11-14 | Disposition: A | Discharge status: Home / Self Care | Payer: Medicare Other | Source: Ambulatory Visit | Attending: Internal Medicine | Admitting: Internal Medicine

## 2010-11-14 DIAGNOSIS — Z1231 Encounter for screening mammogram for malignant neoplasm of breast: Secondary | ICD-10-CM

## 2011-02-01 ENCOUNTER — Telehealth: Payer: Self-pay | Admitting: Radiology

## 2011-02-01 NOTE — Telephone Encounter (Signed)
Pt states that she has not started trial use of graduated compression garment for conservative Rx of varicose veins.    States that she will call back for follow up appointment if/when she has used grad comp hose for 3 mo trial as recommended by Dr. Lowella Dandy.

## 2011-03-23 DIAGNOSIS — Z79899 Other long term (current) drug therapy: Secondary | ICD-10-CM | POA: Insufficient documentation

## 2011-03-23 DIAGNOSIS — K5900 Constipation, unspecified: Secondary | ICD-10-CM | POA: Insufficient documentation

## 2011-03-23 DIAGNOSIS — R062 Wheezing: Secondary | ICD-10-CM | POA: Insufficient documentation

## 2011-03-24 ENCOUNTER — Telehealth: Payer: Self-pay

## 2011-03-24 NOTE — Telephone Encounter (Signed)
Pt aware last cpe date 10/18/2010

## 2011-04-20 ENCOUNTER — Encounter: Payer: Self-pay | Admitting: Internal Medicine

## 2011-04-24 ENCOUNTER — Ambulatory Visit: Payer: Medicare Other | Admitting: Internal Medicine

## 2011-04-24 DIAGNOSIS — Z0289 Encounter for other administrative examinations: Secondary | ICD-10-CM

## 2011-05-25 ENCOUNTER — Other Ambulatory Visit: Payer: Self-pay | Admitting: Internal Medicine

## 2011-08-30 ENCOUNTER — Other Ambulatory Visit: Payer: Self-pay | Admitting: Internal Medicine

## 2011-08-30 ENCOUNTER — Telehealth: Payer: Self-pay | Admitting: Internal Medicine

## 2011-08-30 MED ORDER — BENAZEPRIL HCL 20 MG PO TABS: 40.0000 mg | ORAL_TABLET | Freq: Every day | ORAL | Status: DC

## 2011-08-30 NOTE — Telephone Encounter (Signed)
Refill done.  

## 2011-08-30 NOTE — Telephone Encounter (Signed)
Refill: Benazepril 20mg  tab. Take 2 tablets by mouth every day.. Qty 180. Last fill 05-25-11

## 2011-11-06 DIAGNOSIS — H11001 Unspecified pterygium of right eye: Secondary | ICD-10-CM | POA: Insufficient documentation

## 2011-11-06 DIAGNOSIS — H26499 Other secondary cataract, unspecified eye: Secondary | ICD-10-CM | POA: Insufficient documentation

## 2011-11-06 DIAGNOSIS — H43819 Vitreous degeneration, unspecified eye: Secondary | ICD-10-CM | POA: Insufficient documentation

## 2011-11-06 DIAGNOSIS — H01003 Unspecified blepharitis right eye, unspecified eyelid: Secondary | ICD-10-CM | POA: Insufficient documentation

## 2011-11-06 DIAGNOSIS — Z961 Presence of intraocular lens: Secondary | ICD-10-CM | POA: Insufficient documentation

## 2011-11-06 DIAGNOSIS — H18459 Nodular corneal degeneration, unspecified eye: Secondary | ICD-10-CM | POA: Insufficient documentation

## 2011-11-30 ENCOUNTER — Other Ambulatory Visit: Payer: Self-pay | Admitting: Internal Medicine

## 2011-11-30 DIAGNOSIS — Z1231 Encounter for screening mammogram for malignant neoplasm of breast: Secondary | ICD-10-CM

## 2011-12-14 ENCOUNTER — Ambulatory Visit
Admission: RE | Admit: 2011-12-14 | Discharge: 2011-12-14 | Disposition: A | Discharge status: Home / Self Care | Payer: Medicare Other | Source: Ambulatory Visit | Attending: Internal Medicine | Admitting: Internal Medicine

## 2011-12-14 DIAGNOSIS — Z1231 Encounter for screening mammogram for malignant neoplasm of breast: Secondary | ICD-10-CM

## 2011-12-21 ENCOUNTER — Other Ambulatory Visit: Payer: Self-pay | Admitting: Internal Medicine

## 2011-12-21 NOTE — Telephone Encounter (Signed)
Patient, will refill 30 days of HCTZ and benazepril Schedule of his visit, no further refills  W/o OV

## 2011-12-21 NOTE — Telephone Encounter (Signed)
LMOVM for pt to return call to schedule OV.

## 2011-12-21 NOTE — Telephone Encounter (Signed)
Pt called advise her she needed an appt to get her refills, she stated that is not necessary as she has changed dr., I advised her to let [facility] know as they requested refills from [place]

## 2011-12-21 NOTE — Telephone Encounter (Signed)
Called [facility] pharmacy & cancelled rx's.

## 2011-12-21 NOTE — Telephone Encounter (Signed)
See phone note

## 2011-12-21 NOTE — Telephone Encounter (Signed)
Pt has not been seen within a year. OK to refill? 

## 2012-03-13 DIAGNOSIS — H02409 Unspecified ptosis of unspecified eyelid: Secondary | ICD-10-CM | POA: Insufficient documentation

## 2012-03-13 DIAGNOSIS — H02839 Dermatochalasis of unspecified eye, unspecified eyelid: Secondary | ICD-10-CM | POA: Insufficient documentation

## 2012-03-13 DIAGNOSIS — H5340 Unspecified visual field defects: Secondary | ICD-10-CM | POA: Insufficient documentation

## 2012-09-02 ENCOUNTER — Encounter: Payer: Self-pay | Admitting: Internal Medicine

## 2012-09-03 ENCOUNTER — Encounter: Payer: Self-pay | Admitting: Internal Medicine

## 2013-02-03 ENCOUNTER — Encounter: Payer: Self-pay | Admitting: Internal Medicine

## 2013-02-03 ENCOUNTER — Other Ambulatory Visit: Payer: Self-pay

## 2013-02-03 DIAGNOSIS — Z1231 Encounter for screening mammogram for malignant neoplasm of breast: Secondary | ICD-10-CM

## 2013-02-17 HISTORY — PX: BLEPHAROPLASTY: SUR158

## 2013-02-27 ENCOUNTER — Ambulatory Visit
Admission: RE | Admit: 2013-02-27 | Discharge: 2013-02-27 | Disposition: A | Discharge status: Home / Self Care | Payer: Medicare Other | Source: Ambulatory Visit

## 2013-02-27 DIAGNOSIS — Z1231 Encounter for screening mammogram for malignant neoplasm of breast: Secondary | ICD-10-CM

## 2013-03-05 ENCOUNTER — Ambulatory Visit (AMBULATORY_SURGERY_CENTER): Payer: Self-pay

## 2013-03-05 VITALS — Ht 66.75 in | Wt 123.0 lb

## 2013-03-05 DIAGNOSIS — Z860100 Personal history of colon polyps, unspecified: Secondary | ICD-10-CM

## 2013-03-05 MED ORDER — SUPREP BOWEL PREP KIT 17.5-3.13-1.6 GM/177ML PO SOLN: 1.0000 | Freq: Once | ORAL | Status: DC

## 2013-03-07 ENCOUNTER — Encounter: Payer: Self-pay | Admitting: Internal Medicine

## 2013-03-21 ENCOUNTER — Ambulatory Visit (AMBULATORY_SURGERY_CENTER): Payer: Medicare Other | Admitting: Internal Medicine

## 2013-03-21 ENCOUNTER — Encounter: Payer: Self-pay | Admitting: Internal Medicine

## 2013-03-21 VITALS — BP 139/81 | HR 70 | Temp 96.0°F | Resp 14 | Ht 66.5 in | Wt 123.0 lb

## 2013-03-21 DIAGNOSIS — D126 Benign neoplasm of colon, unspecified: Secondary | ICD-10-CM

## 2013-03-21 DIAGNOSIS — Z86010 Personal history of colonic polyps: Secondary | ICD-10-CM

## 2013-03-21 HISTORY — PX: COLONOSCOPY WITH PROPOFOL: SHX5780

## 2013-03-21 MED ORDER — SODIUM CHLORIDE 0.9 % IV SOLN: 500.0000 mL | INTRAVENOUS | Status: DC

## 2013-03-21 NOTE — Progress Notes (Signed)
Called to room to assist during endoscopic procedure.  Patient ID and intended procedure confirmed with present staff. Received instructions for my participation in the procedure from the performing physician.  

## 2013-03-21 NOTE — Patient Instructions (Addendum)
3 tiny polyps were removed. They look benign.  I will let you know pathology results and when to have another routine colonoscopy by mail. It may not make sense to do another one as you get older but we can see.  It is the time of year to have a vaccination to prevent the flu (influenza virus). Please have this done through your primary care provider or you can get this done at local pharmacies or the [facility]. It would be very helpful if you notify your primary care provider when and where you had the vaccination given by messaging them in My Chart, leaving a message or faxing the information.  I appreciate the opportunity to care for you. Iva Boop, MD, FACG  YOU HAD AN ENDOSCOPIC PROCEDURE TODAY AT THE [facility]: Refer to the procedure report that was given to you for any specific questions about what was found during the examination.  If the procedure report does not answer your questions, please call your gastroenterologist to clarify.  If you requested that your care partner not be given the details of your procedure findings, then the procedure report has been included in a sealed envelope for you to review at your convenience later.  YOU SHOULD EXPECT: Some feelings of bloating in the abdomen. Passage of more gas than usual.  Walking can help get rid of the air that was put into your GI tract during the procedure and reduce the bloating. If you had a lower endoscopy (such as a colonoscopy or flexible sigmoidoscopy) you may notice spotting of blood in your stool or on the toilet paper. If you underwent a bowel prep for your procedure, then you may not have a normal bowel movement for a few days.  DIET: Your first meal following the procedure should be a light meal and then it is ok to progress to your normal diet.  A half-sandwich or bowl of soup is an example of a good first meal.  Heavy or fried foods are harder to digest and may make you feel nauseous or bloated.   Likewise meals heavy in dairy and vegetables can cause extra gas to form and this can also increase the bloating.  Drink plenty of fluids but you should avoid alcoholic beverages for 24 hours.  ACTIVITY: Your care partner should take you home directly after the procedure.  You should plan to take it easy, moving slowly for the rest of the day.  You can resume normal activity the day after the procedure however you should NOT DRIVE or use heavy machinery for 24 hours (because of the sedation medicines used during the test).    SYMPTOMS TO REPORT IMMEDIATELY: A gastroenterologist can be reached at any hour.  During normal business hours, 8:30 AM to 5:00 PM Monday through Friday, call [phone].  After hours and on weekends, please call the GI answering service at [phone] who will take a message and have the physician on call contact you.   Following lower endoscopy (colonoscopy or flexible sigmoidoscopy):  Excessive amounts of blood in the stool  Significant tenderness or worsening of abdominal pains  Swelling of the abdomen that is new, acute  Fever of 100F or higher  FOLLOW UP: If any biopsies were taken you will be contacted by phone or by letter within the next 1-3 weeks.  Call your gastroenterologist if you have not heard about the biopsies in 3 weeks.  Our staff will call the home number listed  on your records the next business day following your procedure to check on you and address any questions or concerns that you may have at that time regarding the information given to you following your procedure. This is a courtesy call and so if there is no answer at the home number and we have not heard from you through the emergency physician on call, we will assume that you have returned to your regular daily activities without incident.  SIGNATURES/CONFIDENTIALITY: You and/or your care partner have signed paperwork which will be entered into your electronic medical record.  These  signatures attest to the fact that that the information above on your After Visit Summary has been reviewed and is understood.  Full responsibility of the confidentiality of this discharge information lies with you and/or your care-partner.  Polyp information given.

## 2013-03-21 NOTE — Op Note (Signed)
[facility] [address] [place], [address]   COLONOSCOPY PROCEDURE REPORT  PATIENT: Christie, Peters  MR#: [id number] BIRTHDATE: 11/09/1935 , 76  yrs. old GENDER: Female ENDOSCOPIST: Iva Boop, MD, [facility] PROCEDURE DATE:  03/21/2013 PROCEDURE:   Colonoscopy with biopsy First Screening Colonoscopy - Avg.  risk and is 50 yrs.  old or older - No.  Prior Negative Screening - Now for repeat screening. N/A  History of Adenoma - Now for follow-up colonoscopy & has been > or = to 3 yrs.  Yes hx of adenoma.  Has been 3 or more years since last colonoscopy.  Polyps Removed Today? Yes. ASA CLASS:   Class II INDICATIONS:Patient's personal history of adenomatous colon polyps.  MEDICATIONS: propofol (Diprivan) 250mg  IV, MAC sedation, administered by CRNA, and These medications were titrated to patient response per physician's verbal order  DESCRIPTION OF PROCEDURE:   After the risks benefits and alternatives of the procedure were thoroughly explained, informed consent was obtained.  A digital rectal exam revealed no abnormalities of the rectum, A digital rectal exam revealed no prostatic nodules, and A digital rectal exam revealed the prostate was not enlarged.   The LB [id number]  endoscope was introduced through the anus and advanced to the cecum, which was identified by both the appendix and ileocecal valve. No adverse events experienced.   The quality of the prep was excellent using Suprep  The instrument was then slowly withdrawn as the colon was fully examined.  COLON FINDINGS: Three diminutive polyps were found in the transverse colon, at the cecum, and in the sigmoid colon.  A polypectomy was performed with cold forceps.  The resection was complete and the polyp tissue was completely retrieved.   The colon mucosa was otherwise normal.  Retroflexed views revealed no abnormalities. The time to cecum=6 minutes 46 seconds.  Withdrawal time=13 minutes  54 seconds.  The scope was withdrawn and the procedure completed. COMPLICATIONS: There were no complications.  ENDOSCOPIC IMPRESSION: 1.   Three diminutive polyps were found in the transverse colon, at the cecum, and in the sigmoid colon; polypectomy was performed with cold forceps 2.   The colon mucosa was otherwise normal - excellent prep - hx adenomas in past  RECOMMENDATIONS: Timing of repeat colonoscopy will be determined by pathology findings - ? if will need another routine exam.   eSigned:  Iva Boop, MD, [facility] 03/21/2013 2:00 PM   cc: Jarome Matin, MD and The Patient

## 2013-03-21 NOTE — Progress Notes (Signed)
Patient did not experience any of the following events: a burn prior to discharge; a fall within the facility; wrong site/side/patient/procedure/implant event; or a hospital transfer or hospital admission upon discharge from the facility. (G8907) Patient did not have preoperative order for IV antibiotic SSI prophylaxis. (G8918)  

## 2013-03-21 NOTE — Progress Notes (Signed)
Stable to RR 

## 2013-03-24 ENCOUNTER — Telehealth: Payer: Self-pay | Admitting: *Deleted

## 2013-03-24 NOTE — Telephone Encounter (Signed)
  Follow up Call-  Call back number 03/21/2013  Post procedure Call Back phone  # [phone]  Permission to leave phone message Yes     Patient questions:  Do you have a fever, pain , or abdominal swelling? no Pain Score  0 *  Have you tolerated food without any problems? yes  Have you been able to return to your normal activities? yes  Do you have any questions about your discharge instructions: Diet   no Medications  no Follow up visit  no  Do you have questions or concerns about your Care? no  Actions: * If pain score is 4 or above: No action needed, pain <4.

## 2013-03-31 ENCOUNTER — Encounter: Payer: Self-pay | Admitting: Internal Medicine

## 2013-03-31 NOTE — Progress Notes (Signed)
Quick Note:  2 adenomas and 1 hyperplastic - all diminutive ? Recall - will consider in 5 years - office visit recall ______

## 2013-11-20 DIAGNOSIS — H5369 Other night blindness: Secondary | ICD-10-CM | POA: Insufficient documentation

## 2013-11-20 DIAGNOSIS — Z961 Presence of intraocular lens: Secondary | ICD-10-CM | POA: Insufficient documentation

## 2014-02-09 DIAGNOSIS — R600 Localized edema: Secondary | ICD-10-CM | POA: Insufficient documentation

## 2014-02-09 DIAGNOSIS — H6120 Impacted cerumen, unspecified ear: Secondary | ICD-10-CM | POA: Insufficient documentation

## 2014-02-10 ENCOUNTER — Other Ambulatory Visit: Payer: Self-pay

## 2014-02-10 ENCOUNTER — Encounter (HOSPITAL_COMMUNITY): Payer: Medicare Other

## 2014-02-10 DIAGNOSIS — Z1231 Encounter for screening mammogram for malignant neoplasm of breast: Secondary | ICD-10-CM

## 2014-03-02 ENCOUNTER — Ambulatory Visit
Admission: RE | Admit: 2014-03-02 | Discharge: 2014-03-02 | Disposition: A | Discharge status: Home / Self Care | Payer: Medicare Other | Source: Ambulatory Visit

## 2014-03-02 DIAGNOSIS — Z1231 Encounter for screening mammogram for malignant neoplasm of breast: Secondary | ICD-10-CM

## 2014-03-13 DIAGNOSIS — M542 Cervicalgia: Secondary | ICD-10-CM | POA: Insufficient documentation

## 2014-04-29 ENCOUNTER — Other Ambulatory Visit: Payer: Self-pay | Admitting: Dermatology

## 2014-06-02 DIAGNOSIS — M720 Palmar fascial fibromatosis [Dupuytren]: Secondary | ICD-10-CM | POA: Insufficient documentation

## 2014-09-20 DIAGNOSIS — M720 Palmar fascial fibromatosis [Dupuytren]: Secondary | ICD-10-CM

## 2014-09-20 HISTORY — DX: Palmar fascial fibromatosis (dupuytren): M72.0

## 2014-09-21 DIAGNOSIS — M151 Heberden's nodes (with arthropathy): Secondary | ICD-10-CM | POA: Diagnosis not present

## 2014-09-21 DIAGNOSIS — M720 Palmar fascial fibromatosis [Dupuytren]: Secondary | ICD-10-CM | POA: Diagnosis not present

## 2014-09-21 DIAGNOSIS — M1811 Unilateral primary osteoarthritis of first carpometacarpal joint, right hand: Secondary | ICD-10-CM | POA: Diagnosis not present

## 2014-09-21 DIAGNOSIS — M1812 Unilateral primary osteoarthritis of first carpometacarpal joint, left hand: Secondary | ICD-10-CM | POA: Diagnosis not present

## 2014-09-23 ENCOUNTER — Other Ambulatory Visit: Payer: Self-pay | Admitting: Orthopedic Surgery

## 2014-09-23 DIAGNOSIS — H18453 Nodular corneal degeneration, bilateral: Secondary | ICD-10-CM | POA: Diagnosis not present

## 2014-09-23 DIAGNOSIS — Z961 Presence of intraocular lens: Secondary | ICD-10-CM | POA: Diagnosis not present

## 2014-09-23 DIAGNOSIS — H04123 Dry eye syndrome of bilateral lacrimal glands: Secondary | ICD-10-CM | POA: Diagnosis not present

## 2014-09-23 DIAGNOSIS — H52213 Irregular astigmatism, bilateral: Secondary | ICD-10-CM | POA: Diagnosis not present

## 2014-09-28 ENCOUNTER — Encounter (HOSPITAL_BASED_OUTPATIENT_CLINIC_OR_DEPARTMENT_OTHER): Payer: Self-pay | Admitting: *Deleted

## 2014-09-28 NOTE — Pre-Procedure Instructions (Signed)
To come for BMET and EKG 

## 2014-09-30 ENCOUNTER — Encounter (HOSPITAL_BASED_OUTPATIENT_CLINIC_OR_DEPARTMENT_OTHER)
Admission: RE | Admit: 2014-09-30 | Discharge: 2014-09-30 | Disposition: A | Discharge status: Home / Self Care | Payer: Medicare Other | Source: Ambulatory Visit | Attending: Orthopedic Surgery | Admitting: Orthopedic Surgery

## 2014-09-30 DIAGNOSIS — Z87891 Personal history of nicotine dependence: Secondary | ICD-10-CM | POA: Diagnosis not present

## 2014-09-30 DIAGNOSIS — I10 Essential (primary) hypertension: Secondary | ICD-10-CM | POA: Diagnosis not present

## 2014-09-30 DIAGNOSIS — M720 Palmar fascial fibromatosis [Dupuytren]: Secondary | ICD-10-CM | POA: Diagnosis not present

## 2014-09-30 LAB — BASIC METABOLIC PANEL
Anion gap: 8 (ref 5–15)
BUN: 14 mg/dL (ref 6–20)
CHLORIDE: 96 mmol/L — AB (ref 101–111)
CO2: 33 mmol/L — AB (ref 22–32)
Calcium: 10 mg/dL (ref 8.9–10.3)
Creatinine, Ser: 0.67 mg/dL (ref 0.44–1.00)
GFR calc Af Amer: >60 mL/min (ref >60)
GFR calc non Af Amer: >60 mL/min (ref >60)
Glucose, Bld: 106 mg/dL — ABNORMAL HIGH (ref 70–99)
Potassium: 4.2 mmol/L (ref 3.5–5.1)
Sodium: 137 mmol/L (ref 135–145)

## 2014-10-01 ENCOUNTER — Encounter (HOSPITAL_BASED_OUTPATIENT_CLINIC_OR_DEPARTMENT_OTHER): Payer: Self-pay | Admitting: Orthopedic Surgery

## 2014-10-01 ENCOUNTER — Ambulatory Visit (HOSPITAL_BASED_OUTPATIENT_CLINIC_OR_DEPARTMENT_OTHER)
Admission: RE | Admit: 2014-10-01 | Discharge: 2014-10-01 | Disposition: A | Discharge status: Home / Self Care | Payer: Medicare Other | Source: Ambulatory Visit | Attending: Orthopedic Surgery | Admitting: Orthopedic Surgery

## 2014-10-01 ENCOUNTER — Encounter (HOSPITAL_BASED_OUTPATIENT_CLINIC_OR_DEPARTMENT_OTHER)
Admission: RE | Disposition: A | Discharge status: Home / Self Care | Payer: Self-pay | Source: Ambulatory Visit | Attending: Orthopedic Surgery

## 2014-10-01 ENCOUNTER — Ambulatory Visit (HOSPITAL_BASED_OUTPATIENT_CLINIC_OR_DEPARTMENT_OTHER): Payer: Medicare Other | Admitting: Anesthesiology

## 2014-10-01 DIAGNOSIS — I10 Essential (primary) hypertension: Secondary | ICD-10-CM | POA: Diagnosis not present

## 2014-10-01 DIAGNOSIS — Z87891 Personal history of nicotine dependence: Secondary | ICD-10-CM | POA: Diagnosis not present

## 2014-10-01 DIAGNOSIS — M720 Palmar fascial fibromatosis [Dupuytren]: Secondary | ICD-10-CM | POA: Diagnosis not present

## 2014-10-01 DIAGNOSIS — M1811 Unilateral primary osteoarthritis of first carpometacarpal joint, right hand: Secondary | ICD-10-CM | POA: Diagnosis not present

## 2014-10-01 HISTORY — DX: Dental restoration status: Z98.811

## 2014-10-01 HISTORY — PX: FASCIOTOMY: SHX132

## 2014-10-01 HISTORY — DX: Palmar fascial fibromatosis (dupuytren): M72.0

## 2014-10-01 LAB — POCT HEMOGLOBIN-HEMACUE: Hemoglobin: 13.2 g/dL (ref 12.0–15.0)

## 2014-10-01 SURGERY — FASCIOTOMY, UPPER EXTREMITY
Anesthesia: Monitor Anesthesia Care | Site: Finger | Laterality: Left

## 2014-10-01 MED ORDER — BUPIVACAINE HCL (PF) 0.25 % IJ SOLN
INTRAMUSCULAR | Status: DC | PRN
  Administered 2014-10-01: 8 mL

## 2014-10-01 MED ORDER — CEFAZOLIN SODIUM-DEXTROSE 2-3 GM-% IV SOLR
2.0000 g | INTRAVENOUS | Status: AC
  Administered 2014-10-01: 2 g via INTRAVENOUS

## 2014-10-01 MED ORDER — 0.9 % SODIUM CHLORIDE (POUR BTL) OPTIME
TOPICAL | Status: DC | PRN
  Administered 2014-10-01: 400 mL

## 2014-10-01 MED ORDER — BUPIVACAINE HCL (PF) 0.25 % IJ SOLN
INTRAMUSCULAR | Status: AC
  Filled 2014-10-01: qty 30

## 2014-10-01 MED ORDER — GLYCOPYRROLATE 0.2 MG/ML IJ SOLN: 0.2000 mg | Freq: Once | INTRAMUSCULAR | Status: DC | PRN

## 2014-10-01 MED ORDER — LIDOCAINE HCL (PF) 0.5 % IJ SOLN
INTRAMUSCULAR | Status: DC | PRN
  Administered 2014-10-01: 50 mL via INTRAVENOUS

## 2014-10-01 MED ORDER — HYDROCODONE-ACETAMINOPHEN 5-325 MG PO TABS: 1.0000 | ORAL_TABLET | Freq: Four times a day (QID) | ORAL | Status: DC | PRN

## 2014-10-01 MED ORDER — CEFAZOLIN SODIUM-DEXTROSE 2-3 GM-% IV SOLR: 2.0000 g | INTRAVENOUS | Status: DC

## 2014-10-01 MED ORDER — MIDAZOLAM HCL 2 MG/2ML IJ SOLN: 1.0000 mg | INTRAMUSCULAR | Status: DC | PRN

## 2014-10-01 MED ORDER — CHLORHEXIDINE GLUCONATE 4 % EX LIQD: 60.0000 mL | Freq: Once | CUTANEOUS | Status: DC

## 2014-10-01 MED ORDER — ONDANSETRON HCL 4 MG/2ML IJ SOLN
INTRAMUSCULAR | Status: DC | PRN
  Administered 2014-10-01: 4 mg via INTRAVENOUS

## 2014-10-01 MED ORDER — PROPOFOL INFUSION 10 MG/ML OPTIME
INTRAVENOUS | Status: DC | PRN
  Administered 2014-10-01: 50 ug/kg/min via INTRAVENOUS

## 2014-10-01 MED ORDER — FENTANYL CITRATE (PF) 100 MCG/2ML IJ SOLN: 25.0000 ug | INTRAMUSCULAR | Status: DC | PRN

## 2014-10-01 MED ORDER — LACTATED RINGERS IV SOLN
INTRAVENOUS | Status: DC
  Administered 2014-10-01: 10 mL/h via INTRAVENOUS

## 2014-10-01 MED ORDER — FENTANYL CITRATE (PF) 100 MCG/2ML IJ SOLN
INTRAMUSCULAR | Status: AC
  Filled 2014-10-01: qty 4

## 2014-10-01 MED ORDER — THROMBIN 5000 UNITS EX SOLR
CUTANEOUS | Status: AC
  Filled 2014-10-01: qty 5000

## 2014-10-01 MED ORDER — CEFAZOLIN SODIUM-DEXTROSE 2-3 GM-% IV SOLR
INTRAVENOUS | Status: AC
  Filled 2014-10-01: qty 50

## 2014-10-01 MED ORDER — PROPOFOL 500 MG/50ML IV EMUL
INTRAVENOUS | Status: AC
  Filled 2014-10-01: qty 50

## 2014-10-01 MED ORDER — FENTANYL CITRATE (PF) 100 MCG/2ML IJ SOLN
INTRAMUSCULAR | Status: DC | PRN
  Administered 2014-10-01: 50 ug via INTRAVENOUS
  Administered 2014-10-01: 25 ug via INTRAVENOUS

## 2014-10-01 MED ORDER — FENTANYL CITRATE (PF) 100 MCG/2ML IJ SOLN: 50.0000 ug | INTRAMUSCULAR | Status: DC | PRN

## 2014-10-01 MED ORDER — MIDAZOLAM HCL 2 MG/2ML IJ SOLN
INTRAMUSCULAR | Status: AC
  Filled 2014-10-01: qty 2

## 2014-10-01 MED ORDER — MIDAZOLAM HCL 2 MG/ML PO SYRP: 12.0000 mg | ORAL_SOLUTION | Freq: Once | ORAL | Status: DC | PRN

## 2014-10-01 SURGICAL SUPPLY — 48 items
BLADE MINI RND TIP GREEN BEAV (BLADE) ×2 IMPLANT
BLADE SURG 15 STRL LF DISP TIS (BLADE) ×1 IMPLANT
BLADE SURG 15 STRL SS (BLADE) ×1
BNDG COHESIVE 3X5 TAN STRL LF (GAUZE/BANDAGES/DRESSINGS) ×2 IMPLANT
BNDG ESMARK 4X9 LF (GAUZE/BANDAGES/DRESSINGS) ×2 IMPLANT
BNDG GAUZE ELAST 4 BULKY (GAUZE/BANDAGES/DRESSINGS) ×2 IMPLANT
CHLORAPREP W/TINT 26ML (MISCELLANEOUS) ×2 IMPLANT
CORDS BIPOLAR (ELECTRODE) ×2 IMPLANT
COVER BACK TABLE 60X90IN (DRAPES) ×2 IMPLANT
COVER MAYO STAND STRL (DRAPES) ×2 IMPLANT
CUFF TOURNIQUET SINGLE 18IN (TOURNIQUET CUFF) IMPLANT
DECANTER SPIKE VIAL GLASS SM (MISCELLANEOUS) IMPLANT
DRAPE EXTREMITY T 121X128X90 (DRAPE) ×2 IMPLANT
DRAPE SURG 17X23 STRL (DRAPES) ×2 IMPLANT
DRSG PAD ABDOMINAL 8X10 ST (GAUZE/BANDAGES/DRESSINGS) IMPLANT
GAUZE SPONGE 4X4 12PLY STRL (GAUZE/BANDAGES/DRESSINGS) ×2 IMPLANT
GAUZE XEROFORM 1X8 LF (GAUZE/BANDAGES/DRESSINGS) ×2 IMPLANT
GLOVE BIO SURGEON STRL SZ 6.5 (GLOVE) ×4 IMPLANT
GLOVE BIOGEL PI IND STRL 7.0 (GLOVE) ×2 IMPLANT
GLOVE BIOGEL PI IND STRL 8 (GLOVE) ×2 IMPLANT
GLOVE BIOGEL PI IND STRL 8.5 (GLOVE) ×1 IMPLANT
GLOVE BIOGEL PI INDICATOR 7.0 (GLOVE) ×2
GLOVE BIOGEL PI INDICATOR 8 (GLOVE) ×2
GLOVE BIOGEL PI INDICATOR 8.5 (GLOVE) ×1
GLOVE EXAM NITRILE MD LF STRL (GLOVE) ×4 IMPLANT
GLOVE SURG ORTHO 8.0 STRL STRW (GLOVE) ×2 IMPLANT
GLOVE SURG SS PI 7.5 STRL IVOR (GLOVE) ×2 IMPLANT
GOWN STRL REUS W/ TWL LRG LVL3 (GOWN DISPOSABLE) ×2 IMPLANT
GOWN STRL REUS W/TWL LRG LVL3 (GOWN DISPOSABLE) ×2
GOWN STRL REUS W/TWL XL LVL3 (GOWN DISPOSABLE) ×4 IMPLANT
NEEDLE PRECISIONGLIDE 27X1.5 (NEEDLE) IMPLANT
NS IRRIG 1000ML POUR BTL (IV SOLUTION) ×2 IMPLANT
PACK BASIN DAY SURGERY FS (CUSTOM PROCEDURE TRAY) ×2 IMPLANT
PAD CAST 3X4 CTTN HI CHSV (CAST SUPPLIES) ×1 IMPLANT
PADDING CAST ABS 4INX4YD NS (CAST SUPPLIES) ×1
PADDING CAST ABS COTTON 4X4 ST (CAST SUPPLIES) ×1 IMPLANT
PADDING CAST COTTON 3X4 STRL (CAST SUPPLIES) ×1
SLEEVE SCD COMPRESS KNEE MED (MISCELLANEOUS) ×2 IMPLANT
SPLINT PLASTER CAST XFAST 3X15 (CAST SUPPLIES) IMPLANT
SPLINT PLASTER XTRA FASTSET 3X (CAST SUPPLIES)
STOCKINETTE 4X48 STRL (DRAPES) ×2 IMPLANT
SUT ETHILON 5 0 PS 2 18 (SUTURE) ×2 IMPLANT
SUT SILK 4 0 PS 2 (SUTURE) IMPLANT
SYR BULB 3OZ (MISCELLANEOUS) ×2 IMPLANT
SYR CONTROL 10ML LL (SYRINGE) IMPLANT
TOWEL OR 17X24 6PK STRL BLUE (TOWEL DISPOSABLE) ×2 IMPLANT
TOWEL OR NON WOVEN STRL DISP B (DISPOSABLE) ×2 IMPLANT
UNDERPAD 30X30 (UNDERPADS AND DIAPERS) ×2 IMPLANT

## 2014-10-01 NOTE — Transfer of Care (Signed)
Immediate Anesthesia Transfer of Care Note  Patient: Christie Peters  Procedure(s) Performed: Procedure(s): FASCIOTOMY LEFT MIDDLE FINGER, FASCIOTOMY LEFT RING FINGER,  FASCIOTOMY LEFT SMALL FINGER  (Left)  Patient Location: PACU  Anesthesia Type:MAC and Bier block  Level of Consciousness: awake, alert  and oriented  Airway & Oxygen Therapy: Patient Spontanous Breathing and Patient connected to face mask oxygen  Post-op Assessment: Report given to RN, Post -op Vital signs reviewed and stable and Patient moving all extremities  Post vital signs: Reviewed and stable  Last Vitals:  Filed Vitals:   10/01/14 0733  BP: 134/86  Pulse: 73  Temp: 36.5 C  Resp: 18    Complications: No apparent anesthesia complications

## 2014-10-01 NOTE — H&P (Signed)
Christie Peters is a 79 year-old right-hand dominant female who comes in complaining of the inability to extend her left small finger.  She has nodules present in her left and right palm.  This has been going on for several years.  She does not recall exactly how long it has been present.  She is of English/Irish descent.  She has no history of diabetes, thyroid problems, arthritis or gout.  She has no similar problems on her feet.  She has no family history that she can recall.  She is not complaining of any pain or discomfort.    ALLERGIES:      None. MEDICATIONS:      Calcium, benazepril, HCTZ and Prolia shots. SURGICAL HISTORY:     Eye surgery, C-sections. FAMILY MEDICAL HISTORY:    Positive for high blood pressure.   SOCIAL HISTORY:      She does not smoke, she quit a year ago. She drinks socially. She is married, retired.   REVIEW OF SYSTEMS:    Positive for glasses, cataracts, hearing loss, high blood pressure, otherwise negative 14 pts. Christie Peters is an 79 y.o. female.   Chief Complaint: dupuytren's contracture left middle, ring and small fingers HPI: see above  Past Medical History  Diagnosis Date  . Hypertension     states under control with meds., has been on med. x 2 yr.  . Dupuytren's contracture of left hand 09/2014    left middle finger  . Dental crowns present     Past Surgical History  Procedure Laterality Date  . Cesarean section    . Colonoscopy with propofol  03/21/2013  . Blepharoplasty Bilateral 02/17/2013    upper lid  . Cataract extraction w/ intraocular lens  implant, bilateral      Family History  Problem Relation Age of Onset  . Diabetes Brother   . Heart disease Brother     CABG  . Breast cancer Sister   . Birth defects Sister   . Stroke Mother    Social History:  reports that she quit smoking about a year ago. She has never used smokeless tobacco. She reports that she drinks alcohol. She reports that she does not use illicit drugs.  Allergies:  No Known Allergies  No prescriptions prior to admission    Results for orders placed or performed during the hospital encounter of 10/01/14 (from the past 48 hour(s))  Basic metabolic panel     Status: Abnormal   Collection Time: 09/30/14  1:15 PM  Result Value Ref Range   Sodium 137 135 - 145 mmol/L   Potassium 4.2 3.5 - 5.1 mmol/L   Chloride 96 (L) 101 - 111 mmol/L   CO2 33 (H) 22 - 32 mmol/L   Glucose, Bld 106 (H) 70 - 99 mg/dL   BUN 14 6 - 20 mg/dL   Creatinine, Ser 0.67 0.44 - 1.00 mg/dL   Calcium 10.0 8.9 - 10.3 mg/dL   GFR calc non Af Amer >60 >60 mL/min   GFR calc Af Amer >60 >60 mL/min    Comment: (NOTE) The eGFR has been calculated using the CKD EPI equation. This calculation has not been validated in all clinical situations. eGFR's persistently <60 mL/min signify possible Chronic Kidney Disease.    Anion gap 8 5 - 15    No results found.   Pertinent items are noted in HPI.  Height 5' 6.5" (1.689 m), weight 56.7 kg (125 lb).  General appearance: alert, cooperative and appears stated  age Head: Normocephalic, without obvious abnormality Neck: no JVD Resp: clear to auscultation bilaterally Cardio: regular rate and rhythm, S1, S2 normal, no murmur, click, rub or gallop GI: soft, non-tender; bowel sounds normal; no masses,  no organomegaly Extremities: dupuytren's contracture left hand Pulses: 2+ and symmetric Skin: Skin color, texture, turgor normal. No rashes or lesions Neurologic: Grossly normal Incision/Wound: na  Assessment/Plan RADIOGRAPHS:     X-rays reveal degenerative change at the [facility] joint of her thumb, the PIP, DIP joints of her fingers.  DIAGNOSIS:     Asymptomatic arthritis, PIP with Dupuytren's contracture especially to the small finger PIP joint.   RECOMMENDATIONS/PLAN:    We have had a long discussion with the etiology of Dupuytren's contracture, treatment alternatives including aponeurotomy, Xiaflex injection, fasciotomy, fasciectomy, the  risks and complications of each are discussed at length. She is aware that with any surgery treatments there is no guarantee, there is possibility of infection, recurrence, injury to arteries, nerves, tendons, incomplete relief of symptoms and dystrophy.  Recurrence rates are discussed, the possibility of flexor tendon injuries discussed with her, especially with Xiaflex small finger.  She would like to have fasciotomies performed to the middle, ring and small fingers in an effort to improve this.  This will be scheduled as an outpatient left hand, middle, ring and small fasciotomies.  Christie Peters R 10/01/2014, 6:06 AM

## 2014-10-01 NOTE — Discharge Instructions (Addendum)

## 2014-10-01 NOTE — Anesthesia Procedure Notes (Signed)
Procedure Name: MAC Date/Time: 10/01/2014 8:46 AM Performed by: Baxter Flattery Pre-anesthesia Checklist: Patient identified, Emergency Drugs available, Suction available, Patient being monitored and Timeout performed Patient Re-evaluated:Patient Re-evaluated prior to inductionOxygen Delivery Method: Simple face mask Preoxygenation: Pre-oxygenation with 100% oxygen Intubation Type: IV induction Dental Injury: Teeth and Oropharynx as per pre-operative assessment

## 2014-10-01 NOTE — Anesthesia Preprocedure Evaluation (Addendum)
Anesthesia Evaluation  Patient identified by MRN, date of birth, ID band Patient awake    Reviewed: Allergy & Precautions, NPO status , Patient's Chart, lab work & pertinent test results  Airway Mallampati: II  TM Distance: >3 FB Neck ROM: Full    Dental   Pulmonary COPDformer smoker,  breath sounds clear to auscultation        Cardiovascular hypertension, Rhythm:Regular Rate:Normal     Neuro/Psych    GI/Hepatic negative GI ROS, Neg liver ROS,   Endo/Other  negative endocrine ROS  Renal/GU negative Renal ROS     Musculoskeletal   Abdominal   Peds  Hematology   Anesthesia Other Findings   Reproductive/Obstetrics                            Anesthesia Physical Anesthesia Plan  ASA: III  Anesthesia Plan: MAC and Regional   Post-op Pain Management:    Induction: Intravenous  Airway Management Planned: Simple Face Mask  Additional Equipment:   Intra-op Plan:   Post-operative Plan:   Informed Consent: I have reviewed the patients History and Physical, chart, labs and discussed the procedure including the risks, benefits and alternatives for the proposed anesthesia with the patient or authorized representative who has indicated his/her understanding and acceptance.   Dental advisory given  Plan Discussed with: Anesthesiologist, CRNA and Surgeon  Anesthesia Plan Comments:         Anesthesia Quick Evaluation

## 2014-10-01 NOTE — Op Note (Signed)
Dictation Number [id number]

## 2014-10-01 NOTE — Anesthesia Postprocedure Evaluation (Signed)
  Anesthesia Post-op Note  Patient: Christie Peters  Procedure(s) Performed: Procedure(s): FASCIOTOMY LEFT MIDDLE FINGER, FASCIOTOMY LEFT RING FINGER,  FASCIOTOMY LEFT SMALL FINGER  (Left)  Patient Location: PACU  Anesthesia Type:MAC  Level of Consciousness: awake  Airway and Oxygen Therapy: Patient Spontanous Breathing  Post-op Pain: mild  Post-op Assessment: Post-op Vital signs reviewed  Post-op Vital Signs: Reviewed  Last Vitals:  Filed Vitals:   10/01/14 1020  BP: 132/76  Pulse: 68  Temp: 36.6 C  Resp: 18    Complications: No apparent anesthesia complications

## 2014-10-01 NOTE — Brief Op Note (Signed)
10/01/2014  9:32 AM  PATIENT:  Christie Peters  79 y.o. female  PRE-OPERATIVE DIAGNOSIS:  DUPUYTREN'S CONTRACTURE LEFT HAND  POST-OPERATIVE DIAGNOSIS:  DUPUYTREN'S CONTRACTURE LEFT HAND  PROCEDURE:  Procedure(s): FASCIOTOMY LEFT MIDDLE FINGER, FASCIOTOMY LEFT RING FINGER,  FASCIOTOMY LEFT SMALL FINGER  (Left)  SURGEON:  Surgeon(s) and Role:    * [name], MD - Primary  PHYSICIAN ASSISTANT:   ASSISTANTS: none   ANESTHESIA:   local and regional  EBL:  Total I/O In: 300 [I.V.:300] Out: -   BLOOD ADMINISTERED:none  DRAINS: none   LOCAL MEDICATIONS USED:  BUPIVICAINE   SPECIMEN:  No Specimen  DISPOSITION OF SPECIMEN:  N/A  COUNTS:  YES  TOURNIQUET:   Total Tourniquet Time Documented: Upper Arm (Left) - 35 minutes Total: Upper Arm (Left) - 35 minutes   DICTATION: .Other Dictation: Dictation Number [id number]  PLAN OF CARE: Discharge to home after PACU  PATIENT DISPOSITION:  PACU - hemodynamically stable.

## 2014-10-02 ENCOUNTER — Encounter (HOSPITAL_BASED_OUTPATIENT_CLINIC_OR_DEPARTMENT_OTHER): Payer: Self-pay | Admitting: Orthopedic Surgery

## 2014-10-02 NOTE — Op Note (Signed)
NAMEKAMAYAH, PILLAY NO.:  [id number]  MEDICAL RECORD NO.:  [id number]  LOCATION:                                FACILITY:  [facility]  PHYSICIAN:  [name], M.D.       DATE OF BIRTH:  01/30/1936  DATE OF PROCEDURE:  10/01/2014 DATE OF DISCHARGE:  10/01/2014                              OPERATIVE REPORT   PREOPERATIVE DIAGNOSIS:  Dupuytren's contracture, left middle ring and small fingers.  POSTOPERATIVE DIAGNOSIS:  Dupuytren's contracture, left middle ring and small fingers.  OPERATION:  Fasciotomy, left middle, left ring, left small fingers.  SURGEON:  [name], MD  ANESTHESIA:  Upper arm IV regional with local infiltration.  HISTORY:  The patient is a 79 year old female with a history of Dupuytren's contracture to the middle ring and small finger with a significant deformity to the small finger MP, PIP joint, this was passively correctable to near full extension with flexion of the metacarpophalangeal joint allowing the PIP to come nearly straight with an abductor digiti quinti cord.  Cords are present to the middle ring fingers also.  She has elected to undergo fasciotomy rather than fasciectomy, Xiaflex injection or aponeurotomy.  Pre, peri, postoperative course have been discussed along with risks and complications.  She is aware there is no guarantee with the surgery, possibility of infection, injury to arteries, tendons, nerves, recurrence, dystrophy, open areas on the skin which will heal with secondary intention.  In the preoperative area, the patient was seen, the extremity marked by both patient and surgeon.  Antibiotic given.  PROCEDURE IN DETAIL:  The patient was brought to the operating room, where an upper arm IV regional anesthetic was carried out without difficulty.  She was prepped using ChloraPrep, supine position with the left arm free.  A 3-minute dry time was allowed.  Time-out taken, confirming patient and procedure.  After  adequate anesthesia was afforded, a transverse incision was made at the proximal aspect of the palmar fascia, attachment to the flexor retinaculum.  This was incised after localization of neurovascular structures with sharp dissection.  A separate incision was then made distally on the middle finger, carried down through subcutaneous tissue.  The cord was again identified. Neurovascular structure was protected.  The cord transected with sharp dissection.  The ring finger was attended to next.  Several transverse incisions were made on each of the fingers to allow on transection of the cords.  This was done to the middle finger.  The little finger was attended to next with an incision made over the central cord and 2 over the lateral side isolating the abductor digiti quinti cord.  This was transected, protecting on the ulnar aspect, the origin of the abductor musculotendinous junction and then again over the mid proximal phalanx. Again neurovascular structures were protected.  The wounds were copiously irrigated with saline.  The portions of each of the wounds were closed, 3 were left open due to the amount of tension present.  The PIP joint came fully straight, transection of the cord on the small finger.  The wounds were irrigated, the area was infiltrated with 0.25% bupivacaine without epinephrine, approximately 8 mL was used.  Sterile compressive dressing was applied.  Partially, the tourniquet was deflated, the fingers pinked, the dressing completed and she was taken to the recovery room for observation in satisfactory condition.  She will be discharged home to return to the [facility] in 1 week on Vicodin.          ______________________________ [name], M.D.     GK/MEDQ  D:  10/01/2014  T:  10/02/2014  Job:  [id number]

## 2014-12-12 DIAGNOSIS — Z7982 Long term (current) use of aspirin: Secondary | ICD-10-CM | POA: Diagnosis not present

## 2014-12-12 DIAGNOSIS — J189 Pneumonia, unspecified organism: Secondary | ICD-10-CM | POA: Diagnosis not present

## 2014-12-12 DIAGNOSIS — I10 Essential (primary) hypertension: Secondary | ICD-10-CM | POA: Diagnosis not present

## 2014-12-12 DIAGNOSIS — Z87891 Personal history of nicotine dependence: Secondary | ICD-10-CM | POA: Diagnosis not present

## 2014-12-12 DIAGNOSIS — R079 Chest pain, unspecified: Secondary | ICD-10-CM | POA: Diagnosis not present

## 2014-12-12 DIAGNOSIS — R000 Tachycardia, unspecified: Secondary | ICD-10-CM | POA: Diagnosis not present

## 2014-12-12 DIAGNOSIS — J181 Lobar pneumonia, unspecified organism: Secondary | ICD-10-CM | POA: Diagnosis not present

## 2014-12-12 DIAGNOSIS — R0602 Shortness of breath: Secondary | ICD-10-CM | POA: Diagnosis not present

## 2014-12-23 DIAGNOSIS — R197 Diarrhea, unspecified: Secondary | ICD-10-CM | POA: Diagnosis not present

## 2014-12-23 DIAGNOSIS — Z6820 Body mass index (BMI) 20.0-20.9, adult: Secondary | ICD-10-CM | POA: Diagnosis not present

## 2014-12-23 DIAGNOSIS — M859 Disorder of bone density and structure, unspecified: Secondary | ICD-10-CM | POA: Diagnosis not present

## 2014-12-23 DIAGNOSIS — J189 Pneumonia, unspecified organism: Secondary | ICD-10-CM | POA: Diagnosis not present

## 2014-12-23 DIAGNOSIS — R0602 Shortness of breath: Secondary | ICD-10-CM | POA: Diagnosis not present

## 2015-01-08 ENCOUNTER — Other Ambulatory Visit (HOSPITAL_COMMUNITY): Payer: Self-pay | Admitting: *Deleted

## 2015-01-11 ENCOUNTER — Ambulatory Visit (HOSPITAL_COMMUNITY): Payer: BLUE CROSS/BLUE SHIELD | Attending: Internal Medicine

## 2015-02-05 DIAGNOSIS — H1789 Other corneal scars and opacities: Secondary | ICD-10-CM | POA: Diagnosis not present

## 2015-02-05 DIAGNOSIS — H52203 Unspecified astigmatism, bilateral: Secondary | ICD-10-CM | POA: Diagnosis not present

## 2015-02-05 DIAGNOSIS — H26491 Other secondary cataract, right eye: Secondary | ICD-10-CM | POA: Diagnosis not present

## 2015-02-08 ENCOUNTER — Other Ambulatory Visit: Payer: Self-pay

## 2015-02-08 DIAGNOSIS — Z1231 Encounter for screening mammogram for malignant neoplasm of breast: Secondary | ICD-10-CM

## 2015-02-10 DIAGNOSIS — I10 Essential (primary) hypertension: Secondary | ICD-10-CM | POA: Diagnosis not present

## 2015-02-10 DIAGNOSIS — R8290 Unspecified abnormal findings in urine: Secondary | ICD-10-CM | POA: Diagnosis not present

## 2015-02-10 DIAGNOSIS — M859 Disorder of bone density and structure, unspecified: Secondary | ICD-10-CM | POA: Diagnosis not present

## 2015-02-10 DIAGNOSIS — N390 Urinary tract infection, site not specified: Secondary | ICD-10-CM | POA: Diagnosis not present

## 2015-02-11 DIAGNOSIS — I10 Essential (primary) hypertension: Secondary | ICD-10-CM | POA: Diagnosis not present

## 2015-02-11 DIAGNOSIS — Z6821 Body mass index (BMI) 21.0-21.9, adult: Secondary | ICD-10-CM | POA: Diagnosis not present

## 2015-02-11 DIAGNOSIS — R0602 Shortness of breath: Secondary | ICD-10-CM | POA: Diagnosis not present

## 2015-02-11 DIAGNOSIS — J189 Pneumonia, unspecified organism: Secondary | ICD-10-CM | POA: Diagnosis not present

## 2015-02-17 DIAGNOSIS — I10 Essential (primary) hypertension: Secondary | ICD-10-CM | POA: Diagnosis not present

## 2015-02-17 DIAGNOSIS — Z23 Encounter for immunization: Secondary | ICD-10-CM | POA: Diagnosis not present

## 2015-02-17 DIAGNOSIS — E785 Hyperlipidemia, unspecified: Secondary | ICD-10-CM | POA: Diagnosis not present

## 2015-02-17 DIAGNOSIS — Z1389 Encounter for screening for other disorder: Secondary | ICD-10-CM | POA: Diagnosis not present

## 2015-02-17 DIAGNOSIS — J449 Chronic obstructive pulmonary disease, unspecified: Secondary | ICD-10-CM | POA: Diagnosis not present

## 2015-02-17 DIAGNOSIS — M859 Disorder of bone density and structure, unspecified: Secondary | ICD-10-CM | POA: Diagnosis not present

## 2015-02-17 DIAGNOSIS — Z6821 Body mass index (BMI) 21.0-21.9, adult: Secondary | ICD-10-CM | POA: Diagnosis not present

## 2015-02-17 DIAGNOSIS — J189 Pneumonia, unspecified organism: Secondary | ICD-10-CM | POA: Diagnosis not present

## 2015-02-17 DIAGNOSIS — Z0000 Encounter for general adult medical examination without abnormal findings: Secondary | ICD-10-CM | POA: Diagnosis not present

## 2015-02-22 DIAGNOSIS — Z1212 Encounter for screening for malignant neoplasm of rectum: Secondary | ICD-10-CM | POA: Diagnosis not present

## 2015-03-12 ENCOUNTER — Ambulatory Visit: Payer: BLUE CROSS/BLUE SHIELD

## 2015-03-24 DIAGNOSIS — R8299 Other abnormal findings in urine: Secondary | ICD-10-CM | POA: Diagnosis not present

## 2015-03-24 DIAGNOSIS — N398 Other specified disorders of urinary system: Secondary | ICD-10-CM | POA: Diagnosis not present

## 2015-03-24 DIAGNOSIS — N390 Urinary tract infection, site not specified: Secondary | ICD-10-CM | POA: Diagnosis not present

## 2015-04-05 ENCOUNTER — Ambulatory Visit
Admission: RE | Admit: 2015-04-05 | Discharge: 2015-04-05 | Disposition: A | Discharge status: Home / Self Care | Payer: Medicare Other | Source: Ambulatory Visit

## 2015-04-05 DIAGNOSIS — Z1231 Encounter for screening mammogram for malignant neoplasm of breast: Secondary | ICD-10-CM

## 2015-08-25 DIAGNOSIS — D2271 Melanocytic nevi of right lower limb, including hip: Secondary | ICD-10-CM | POA: Diagnosis not present

## 2015-08-25 DIAGNOSIS — D225 Melanocytic nevi of trunk: Secondary | ICD-10-CM | POA: Diagnosis not present

## 2015-08-25 DIAGNOSIS — D2272 Melanocytic nevi of left lower limb, including hip: Secondary | ICD-10-CM | POA: Diagnosis not present

## 2015-08-25 DIAGNOSIS — L821 Other seborrheic keratosis: Secondary | ICD-10-CM | POA: Diagnosis not present

## 2015-08-25 DIAGNOSIS — L853 Xerosis cutis: Secondary | ICD-10-CM | POA: Diagnosis not present

## 2015-08-25 DIAGNOSIS — D224 Melanocytic nevi of scalp and neck: Secondary | ICD-10-CM | POA: Diagnosis not present

## 2015-08-25 DIAGNOSIS — D1801 Hemangioma of skin and subcutaneous tissue: Secondary | ICD-10-CM | POA: Diagnosis not present

## 2015-10-20 DIAGNOSIS — C44529 Squamous cell carcinoma of skin of other part of trunk: Secondary | ICD-10-CM | POA: Diagnosis not present

## 2015-10-20 DIAGNOSIS — D485 Neoplasm of uncertain behavior of skin: Secondary | ICD-10-CM | POA: Diagnosis not present

## 2015-10-20 DIAGNOSIS — C44622 Squamous cell carcinoma of skin of right upper limb, including shoulder: Secondary | ICD-10-CM | POA: Diagnosis not present

## 2016-01-05 DIAGNOSIS — E784 Other hyperlipidemia: Secondary | ICD-10-CM | POA: Diagnosis not present

## 2016-01-05 DIAGNOSIS — R0600 Dyspnea, unspecified: Secondary | ICD-10-CM | POA: Insufficient documentation

## 2016-01-05 DIAGNOSIS — R0609 Other forms of dyspnea: Secondary | ICD-10-CM | POA: Diagnosis not present

## 2016-01-05 DIAGNOSIS — F17200 Nicotine dependence, unspecified, uncomplicated: Secondary | ICD-10-CM | POA: Insufficient documentation

## 2016-01-05 DIAGNOSIS — Z6821 Body mass index (BMI) 21.0-21.9, adult: Secondary | ICD-10-CM | POA: Diagnosis not present

## 2016-01-05 DIAGNOSIS — J449 Chronic obstructive pulmonary disease, unspecified: Secondary | ICD-10-CM | POA: Diagnosis not present

## 2016-01-05 DIAGNOSIS — I10 Essential (primary) hypertension: Secondary | ICD-10-CM | POA: Diagnosis not present

## 2016-01-10 ENCOUNTER — Other Ambulatory Visit (HOSPITAL_COMMUNITY): Payer: Self-pay | Admitting: Respiratory Therapy

## 2016-01-10 DIAGNOSIS — R0609 Other forms of dyspnea: Secondary | ICD-10-CM

## 2016-01-10 DIAGNOSIS — R0600 Dyspnea, unspecified: Secondary | ICD-10-CM

## 2016-01-10 DIAGNOSIS — J441 Chronic obstructive pulmonary disease with (acute) exacerbation: Secondary | ICD-10-CM

## 2016-01-14 ENCOUNTER — Ambulatory Visit (HOSPITAL_COMMUNITY)
Admission: RE | Admit: 2016-01-14 | Discharge: 2016-01-14 | Disposition: A | Discharge status: Home / Self Care | Payer: PPO | Source: Ambulatory Visit | Attending: Internal Medicine | Admitting: Internal Medicine

## 2016-01-14 DIAGNOSIS — J441 Chronic obstructive pulmonary disease with (acute) exacerbation: Secondary | ICD-10-CM | POA: Diagnosis not present

## 2016-01-14 DIAGNOSIS — R0609 Other forms of dyspnea: Secondary | ICD-10-CM | POA: Diagnosis not present

## 2016-01-14 MED ORDER — ALBUTEROL SULFATE (2.5 MG/3ML) 0.083% IN NEBU
2.5000 mg | INHALATION_SOLUTION | Freq: Once | RESPIRATORY_TRACT | Status: AC
Start: 2016-01-14 — End: 2016-01-14
  Administered 2016-01-14: 2.5 mg via RESPIRATORY_TRACT

## 2016-01-16 LAB — PULMONARY FUNCTION TEST
DL/VA % PRED: 41 %
DL/VA: 2.13 ml/min/mmHg/L
DLCO unc % pred: 21 %
DLCO unc: 5.98 ml/min/mmHg
FEF 25-75 POST: 0.62 L/s
FEF 25-75 Pre: 0.35 L/sec
FEF2575-%CHANGE-POST: 75 %
FEF2575-%PRED-POST: 37 %
FEF2575-%PRED-PRE: 21 %
FEV1-%CHANGE-POST: 19 %
FEV1-%PRED-PRE: 35 %
FEV1-%Pred-Post: 42 %
FEV1-Post: 0.97 L
FEV1-Pre: 0.81 L
FEV1FVC-%CHANGE-POST: -6 %
FEV1FVC-%Pred-Pre: 71 %
FEV6-%Change-Post: 23 %
FEV6-%Pred-Post: 65 %
FEV6-%Pred-Pre: 52 %
FEV6-POST: 1.86 L
FEV6-Pre: 1.51 L
FEV6FVC-%Change-Post: -3 %
FEV6FVC-%Pred-Post: 100 %
FEV6FVC-%Pred-Pre: 104 %
FVC-%CHANGE-POST: 28 %
FVC-%PRED-POST: 65 %
FVC-%PRED-PRE: 50 %
FVC-PRE: 1.53 L
FVC-Post: 1.96 L
Post FEV1/FVC ratio: 49 %
Post FEV6/FVC ratio: 95 %
Pre FEV1/FVC ratio: 53 %
Pre FEV6/FVC Ratio: 99 %
RV % pred: 205 %
RV: 5.08 L
TLC % pred: 122 %
TLC: 6.64 L

## 2016-01-18 DIAGNOSIS — L821 Other seborrheic keratosis: Secondary | ICD-10-CM | POA: Diagnosis not present

## 2016-01-18 DIAGNOSIS — L720 Epidermal cyst: Secondary | ICD-10-CM | POA: Diagnosis not present

## 2016-01-18 DIAGNOSIS — Z85828 Personal history of other malignant neoplasm of skin: Secondary | ICD-10-CM | POA: Diagnosis not present

## 2016-02-02 DIAGNOSIS — Z0100 Encounter for examination of eyes and vision without abnormal findings: Secondary | ICD-10-CM | POA: Diagnosis not present

## 2016-02-02 DIAGNOSIS — H1789 Other corneal scars and opacities: Secondary | ICD-10-CM | POA: Diagnosis not present

## 2016-02-02 DIAGNOSIS — Z961 Presence of intraocular lens: Secondary | ICD-10-CM | POA: Diagnosis not present

## 2016-02-15 DIAGNOSIS — E784 Other hyperlipidemia: Secondary | ICD-10-CM | POA: Diagnosis not present

## 2016-02-15 DIAGNOSIS — R8299 Other abnormal findings in urine: Secondary | ICD-10-CM | POA: Diagnosis not present

## 2016-02-15 DIAGNOSIS — I10 Essential (primary) hypertension: Secondary | ICD-10-CM | POA: Diagnosis not present

## 2016-02-15 DIAGNOSIS — M859 Disorder of bone density and structure, unspecified: Secondary | ICD-10-CM | POA: Diagnosis not present

## 2016-02-15 DIAGNOSIS — N390 Urinary tract infection, site not specified: Secondary | ICD-10-CM | POA: Diagnosis not present

## 2016-02-16 DIAGNOSIS — I10 Essential (primary) hypertension: Secondary | ICD-10-CM | POA: Diagnosis not present

## 2016-02-16 DIAGNOSIS — H18453 Nodular corneal degeneration, bilateral: Secondary | ICD-10-CM | POA: Diagnosis not present

## 2016-02-16 DIAGNOSIS — Z961 Presence of intraocular lens: Secondary | ICD-10-CM | POA: Diagnosis not present

## 2016-02-16 DIAGNOSIS — Z87891 Personal history of nicotine dependence: Secondary | ICD-10-CM | POA: Diagnosis not present

## 2016-02-16 DIAGNOSIS — Z9849 Cataract extraction status, unspecified eye: Secondary | ICD-10-CM | POA: Diagnosis not present

## 2016-02-18 DIAGNOSIS — H179 Unspecified corneal scar and opacity: Secondary | ICD-10-CM | POA: Diagnosis not present

## 2016-02-18 DIAGNOSIS — H18453 Nodular corneal degeneration, bilateral: Secondary | ICD-10-CM | POA: Diagnosis not present

## 2016-02-18 DIAGNOSIS — H18451 Nodular corneal degeneration, right eye: Secondary | ICD-10-CM | POA: Diagnosis not present

## 2016-02-23 DIAGNOSIS — I10 Essential (primary) hypertension: Secondary | ICD-10-CM | POA: Diagnosis not present

## 2016-02-23 DIAGNOSIS — Z87891 Personal history of nicotine dependence: Secondary | ICD-10-CM | POA: Diagnosis not present

## 2016-02-23 DIAGNOSIS — H18459 Nodular corneal degeneration, unspecified eye: Secondary | ICD-10-CM | POA: Diagnosis not present

## 2016-02-23 DIAGNOSIS — Z79899 Other long term (current) drug therapy: Secondary | ICD-10-CM | POA: Diagnosis not present

## 2016-02-23 DIAGNOSIS — Z9889 Other specified postprocedural states: Secondary | ICD-10-CM | POA: Insufficient documentation

## 2016-02-23 DIAGNOSIS — Z961 Presence of intraocular lens: Secondary | ICD-10-CM | POA: Diagnosis not present

## 2016-02-23 DIAGNOSIS — Z9841 Cataract extraction status, right eye: Secondary | ICD-10-CM | POA: Diagnosis not present

## 2016-02-23 DIAGNOSIS — Z9842 Cataract extraction status, left eye: Secondary | ICD-10-CM | POA: Diagnosis not present

## 2016-02-23 DIAGNOSIS — Z7982 Long term (current) use of aspirin: Secondary | ICD-10-CM | POA: Diagnosis not present

## 2016-03-02 DIAGNOSIS — Z0000 Encounter for general adult medical examination without abnormal findings: Secondary | ICD-10-CM | POA: Diagnosis not present

## 2016-03-02 DIAGNOSIS — K5909 Other constipation: Secondary | ICD-10-CM | POA: Diagnosis not present

## 2016-03-02 DIAGNOSIS — E784 Other hyperlipidemia: Secondary | ICD-10-CM | POA: Diagnosis not present

## 2016-03-02 DIAGNOSIS — Z6821 Body mass index (BMI) 21.0-21.9, adult: Secondary | ICD-10-CM | POA: Diagnosis not present

## 2016-03-02 DIAGNOSIS — J449 Chronic obstructive pulmonary disease, unspecified: Secondary | ICD-10-CM | POA: Diagnosis not present

## 2016-03-02 DIAGNOSIS — Z23 Encounter for immunization: Secondary | ICD-10-CM | POA: Diagnosis not present

## 2016-03-02 DIAGNOSIS — M859 Disorder of bone density and structure, unspecified: Secondary | ICD-10-CM | POA: Diagnosis not present

## 2016-03-02 DIAGNOSIS — I10 Essential (primary) hypertension: Secondary | ICD-10-CM | POA: Diagnosis not present

## 2016-03-02 DIAGNOSIS — Z1389 Encounter for screening for other disorder: Secondary | ICD-10-CM | POA: Diagnosis not present

## 2016-03-22 DIAGNOSIS — Z961 Presence of intraocular lens: Secondary | ICD-10-CM | POA: Diagnosis not present

## 2016-03-22 DIAGNOSIS — Z7982 Long term (current) use of aspirin: Secondary | ICD-10-CM | POA: Diagnosis not present

## 2016-03-22 DIAGNOSIS — Z79899 Other long term (current) drug therapy: Secondary | ICD-10-CM | POA: Diagnosis not present

## 2016-03-22 DIAGNOSIS — Z87891 Personal history of nicotine dependence: Secondary | ICD-10-CM | POA: Diagnosis not present

## 2016-03-22 DIAGNOSIS — H18459 Nodular corneal degeneration, unspecified eye: Secondary | ICD-10-CM | POA: Diagnosis not present

## 2016-03-22 DIAGNOSIS — Z9842 Cataract extraction status, left eye: Secondary | ICD-10-CM | POA: Diagnosis not present

## 2016-03-22 DIAGNOSIS — Z48810 Encounter for surgical aftercare following surgery on the sense organs: Secondary | ICD-10-CM | POA: Diagnosis not present

## 2016-03-22 DIAGNOSIS — H26491 Other secondary cataract, right eye: Secondary | ICD-10-CM | POA: Diagnosis not present

## 2016-03-22 DIAGNOSIS — I10 Essential (primary) hypertension: Secondary | ICD-10-CM | POA: Diagnosis not present

## 2016-03-22 DIAGNOSIS — Z9841 Cataract extraction status, right eye: Secondary | ICD-10-CM | POA: Diagnosis not present

## 2016-03-23 DIAGNOSIS — M859 Disorder of bone density and structure, unspecified: Secondary | ICD-10-CM | POA: Diagnosis not present

## 2016-03-23 DIAGNOSIS — I10 Essential (primary) hypertension: Secondary | ICD-10-CM | POA: Diagnosis not present

## 2016-03-23 DIAGNOSIS — N952 Postmenopausal atrophic vaginitis: Secondary | ICD-10-CM | POA: Diagnosis not present

## 2016-03-23 DIAGNOSIS — Z124 Encounter for screening for malignant neoplasm of cervix: Secondary | ICD-10-CM | POA: Diagnosis not present

## 2016-03-23 DIAGNOSIS — Z1231 Encounter for screening mammogram for malignant neoplasm of breast: Secondary | ICD-10-CM | POA: Diagnosis not present

## 2016-03-23 DIAGNOSIS — Z6821 Body mass index (BMI) 21.0-21.9, adult: Secondary | ICD-10-CM | POA: Diagnosis not present

## 2016-03-24 ENCOUNTER — Other Ambulatory Visit: Payer: Self-pay | Admitting: Internal Medicine

## 2016-03-24 DIAGNOSIS — Z1231 Encounter for screening mammogram for malignant neoplasm of breast: Secondary | ICD-10-CM

## 2016-04-07 DIAGNOSIS — H179 Unspecified corneal scar and opacity: Secondary | ICD-10-CM | POA: Diagnosis not present

## 2016-04-07 DIAGNOSIS — H18452 Nodular corneal degeneration, left eye: Secondary | ICD-10-CM | POA: Diagnosis not present

## 2016-04-24 DIAGNOSIS — M810 Age-related osteoporosis without current pathological fracture: Secondary | ICD-10-CM | POA: Diagnosis not present

## 2016-04-25 ENCOUNTER — Ambulatory Visit
Admission: RE | Admit: 2016-04-25 | Discharge: 2016-04-25 | Disposition: A | Discharge status: Home / Self Care | Payer: PPO | Source: Ambulatory Visit | Attending: Internal Medicine | Admitting: Internal Medicine

## 2016-04-25 DIAGNOSIS — Z1231 Encounter for screening mammogram for malignant neoplasm of breast: Secondary | ICD-10-CM | POA: Diagnosis not present

## 2016-05-10 DIAGNOSIS — I10 Essential (primary) hypertension: Secondary | ICD-10-CM | POA: Diagnosis not present

## 2016-05-10 DIAGNOSIS — Z79899 Other long term (current) drug therapy: Secondary | ICD-10-CM | POA: Diagnosis not present

## 2016-05-10 DIAGNOSIS — Z9842 Cataract extraction status, left eye: Secondary | ICD-10-CM | POA: Diagnosis not present

## 2016-05-10 DIAGNOSIS — Z9841 Cataract extraction status, right eye: Secondary | ICD-10-CM | POA: Diagnosis not present

## 2016-05-10 DIAGNOSIS — Z48810 Encounter for surgical aftercare following surgery on the sense organs: Secondary | ICD-10-CM | POA: Diagnosis not present

## 2016-05-10 DIAGNOSIS — Z87891 Personal history of nicotine dependence: Secondary | ICD-10-CM | POA: Diagnosis not present

## 2016-05-10 DIAGNOSIS — Z7982 Long term (current) use of aspirin: Secondary | ICD-10-CM | POA: Diagnosis not present

## 2016-05-10 DIAGNOSIS — Z9889 Other specified postprocedural states: Secondary | ICD-10-CM | POA: Diagnosis not present

## 2016-05-10 DIAGNOSIS — H2640 Unspecified secondary cataract: Secondary | ICD-10-CM | POA: Diagnosis not present

## 2016-05-10 DIAGNOSIS — Z961 Presence of intraocular lens: Secondary | ICD-10-CM | POA: Diagnosis not present

## 2016-05-10 DIAGNOSIS — H0289 Other specified disorders of eyelid: Secondary | ICD-10-CM | POA: Diagnosis not present

## 2016-08-29 DIAGNOSIS — R918 Other nonspecific abnormal finding of lung field: Secondary | ICD-10-CM | POA: Insufficient documentation

## 2016-08-29 DIAGNOSIS — J449 Chronic obstructive pulmonary disease, unspecified: Secondary | ICD-10-CM | POA: Diagnosis not present

## 2016-08-29 DIAGNOSIS — I10 Essential (primary) hypertension: Secondary | ICD-10-CM | POA: Diagnosis not present

## 2016-08-29 DIAGNOSIS — R05 Cough: Secondary | ICD-10-CM | POA: Diagnosis not present

## 2016-08-29 DIAGNOSIS — Z6820 Body mass index (BMI) 20.0-20.9, adult: Secondary | ICD-10-CM | POA: Diagnosis not present

## 2016-08-30 ENCOUNTER — Other Ambulatory Visit: Payer: Self-pay | Admitting: Internal Medicine

## 2016-08-30 DIAGNOSIS — R918 Other nonspecific abnormal finding of lung field: Secondary | ICD-10-CM

## 2016-09-01 ENCOUNTER — Ambulatory Visit
Admission: RE | Admit: 2016-09-01 | Discharge: 2016-09-01 | Disposition: A | Discharge status: Home / Self Care | Payer: PPO | Source: Ambulatory Visit | Attending: Internal Medicine | Admitting: Internal Medicine

## 2016-09-01 DIAGNOSIS — R918 Other nonspecific abnormal finding of lung field: Secondary | ICD-10-CM | POA: Diagnosis not present

## 2016-09-13 ENCOUNTER — Ambulatory Visit (INDEPENDENT_AMBULATORY_CARE_PROVIDER_SITE_OTHER): Payer: PPO | Admitting: Pulmonary Disease

## 2016-09-13 ENCOUNTER — Telehealth: Payer: Self-pay | Admitting: Pulmonary Disease

## 2016-09-13 ENCOUNTER — Encounter: Payer: Self-pay | Admitting: Pulmonary Disease

## 2016-09-13 ENCOUNTER — Other Ambulatory Visit (INDEPENDENT_AMBULATORY_CARE_PROVIDER_SITE_OTHER): Payer: PPO

## 2016-09-13 VITALS — BP 102/80 | HR 88 | Ht 66.5 in | Wt 130.2 lb

## 2016-09-13 DIAGNOSIS — J849 Interstitial pulmonary disease, unspecified: Secondary | ICD-10-CM | POA: Diagnosis not present

## 2016-09-13 DIAGNOSIS — J432 Centrilobular emphysema: Secondary | ICD-10-CM | POA: Diagnosis not present

## 2016-09-13 MED ORDER — ALBUTEROL SULFATE HFA 108 (90 BASE) MCG/ACT IN AERS: 2.0000 | INHALATION_SPRAY | Freq: Four times a day (QID) | RESPIRATORY_TRACT | 6 refills | Status: DC | PRN

## 2016-09-13 NOTE — Telephone Encounter (Signed)
Spoke with the pt  She states someone named Felicia called and left msg to come back for labs  She states they spoke too fast her her and she is confused about what is needed  Called the lab and spoke with Cassie  She states Felicia called the pt to tell her that her CBC needs to be redrawn b/c her blood clotted  I called and informed the pt of this and she verbalized understanding  The pt stated that the tech her drew her blood was very rude and impersonal  I urged her to let the lab manager know this when she returns for labs  She verbalized understanding and will do so  Nothing further needed

## 2016-09-13 NOTE — Patient Instructions (Addendum)
Lab tests today  Will arrange for overnight oxygen test  Follow up in 4 weeks with Dr. Halford Chessman or Nurse Practitioner

## 2016-09-13 NOTE — Progress Notes (Signed)
Past surgical history She  has a past surgical history that includes Cesarean section; Colonoscopy with propofol (03/21/2013); Blepharoplasty (Bilateral, 02/17/2013); Cataract extraction w/ intraocular lens  implant, bilateral; and Fasciotomy (Left, 10/01/2014).  Family history Her family history includes Birth defects in her sister; Breast cancer in her sister; Diabetes in her brother; Heart disease in her brother; Stroke in her mother.  Social history She  reports that she quit smoking about 2 years ago. She has a 30.00 pack-year smoking history. She has never used smokeless tobacco. She reports that she drinks alcohol. She reports that she does not use drugs.  No Known Allergies  Current Outpatient Prescriptions on File Prior to Visit  Medication Sig  . Calcium Carbonate-Vitamin D (CALTRATE 600+D PO) Take by mouth.  . cholecalciferol (VITAMIN D) 1000 UNITS tablet Take 2,000 Units by mouth daily.  . hydrochlorothiazide (HYDRODIURIL) 25 MG tablet TAKE ONE TABLET BY MOUTH EVERY DAY   No current facility-administered medications on file prior to visit.     Chief Complaint  Patient presents with  . PULMONARY CONSULT    Referred by Dr Sharlett Iles for lung nodule. Recent CXR/CT 08/2016. Pt completed Cefdinir 300mg  09/12/16 for a bacterial infection which was seen on CT Scan.     Pulmonary tests PFT 01/14/16 >> FEV1 0.97 (42%), FEV1% 49, TLC 6.64 (122%), RV 5.08 (205%), DLCO 21%, +BD CT chest 09/02/16 >> atherosclerosis, 4.7 cm ascending aorta, centrilobular emphysema, patchy and peripheral opacities  Past medical history She  has a past medical history of Dental crowns present; Dupuytren's contracture of left hand (09/2014); Hyperlipidemia; and Hypertension.  Vital signs BP 102/80 (BP Location: Left Arm, Cuff Size: Normal)   Pulse 88   Ht 5' 6.5" (1.689 m)   Wt 130 lb 3.2 oz (59.1 kg)   SpO2 96%   BMI 20.70 kg/m   History of present illness Christie Peters is a 81 y.o. female former  smoker with abnormal CT chest.  She has extensive prior history of smoking.  She quit about 3 years ago, and used to smoke 1/2 pack per day.  She has chronic symptoms of dyspnea on exertion, but mostly when go up stairs or at a brisk pace.  She had PFTs a year ago that showed severe obstruction, air trapping, severe diffusion defect, and bronchodilatory responsiveness.  She was not using inhalers before.  She travels to [place] during the winter months.  She was in [place] in March.  She thinks she developed a respiratory infection while there.  She developed a dry cough, feeling fatigued, and decreased appetite.  She was on prednisone during this episode, but she didn't feel like this helped.  She was given course of cefdinir and after this she started feeling better.  She is not having much cough, energy level better, and appetite improving.  During her assessment she had CXR which showed nodular infiltrate.  She then had CT chest with results detailed below.  She has chronic runny nose.  She denies headache, sore throat, change in vision, fever, skin rash, joint swelling, leg swelling, nausea, vomiting, dysphagia, or diarrhea.    She is from [place], [place].  She has lived in [place] since the mid 1980's.  She was a homemaker.  Denies exposure to animals or birds. No recent sick exposures.  Denies history of tuberculosis.  She had pneumonia several times previously.    She maintained her oxygen level on room while walking 3 laps in office today.  Physical exam  General - No distress, thin Eyes- pupils reactive ENT - No sinus tenderness, no oral exudate, no LAN, no thyromegaly, TM clear, pupils equal/reactive Cardiac - s1s2 regular, no murmur, pulses symmetric Chest - faint inspiratory squeaks b/l with wheeze that partially clear with cough, no dullness Back - No focal tenderness Abd - Soft, non-tender, no organomegaly, + bowel sounds Ext - No edema Neuro - Normal strength, cranial  nerves intact Skin - No rashes Psych - Normal mood, and behavior   CMP Latest Ref Rng & Units 09/30/2014 10/18/2010 05/31/2009  Glucose 70 - 99 mg/dL 106(H) 100(H) 81  BUN 6 - 20 mg/dL 14 13 11   Creatinine 0.44 - 1.00 mg/dL 0.67 0.6 0.7  Sodium 135 - 145 mmol/L 137 140 138  Potassium 3.5 - 5.1 mmol/L 4.2 4.3 4.5  Chloride 101 - 111 mmol/L 96(L) 99 99  CO2 22 - 32 mmol/L 33(H) 33(H) 32  Calcium 8.9 - 10.3 mg/dL 10.0 9.5 9.6  AST 0 - 37 units/L - - 20  ALT 0 - 35 units/L - - 9     CBC Latest Ref Rng & Units 10/01/2014 10/18/2010 05/31/2009  WBC 4.5 - 10.5 K/uL - 4.5 5.0  Hemoglobin 12.0 - 15.0 g/dL 13.2 13.8 13.6  Hematocrit 36.0 - 46.0 % - 40.4 39.9  Platelets 150.0 - 400.0 K/uL - 262.0 258.0     Ct Chest Wo Contrast  Result Date: 09/02/2016 CLINICAL DATA:  Outside radiograph report describes 1 cm right lower lobe nodule EXAM: CT CHEST WITHOUT CONTRAST TECHNIQUE: Multidetector CT imaging of the chest was performed following the standard protocol without IV contrast. COMPARISON:  None. FINDINGS: Cardiovascular: Atherosclerotic calcifications in the aortic arch and great vessels are noted. Maximal diameter of the ascending aorta is 4.7 cm. There is no intramural hematoma. Three vessel coronary artery calcifications are present. The heart is nonenlarged. Mediastinum/Nodes: Several scattered small mediastinal nodes are present. No obvious pathologically enlarged nodes by measurement criteria. Thyroid gland is small and unremarkable. Lungs/Pleura: There is considerable emphysema throughout the lungs. Innumerable areas of indeterminate and patchy pulmonary opacities are present. Some of these are wedge-shaped and there is a peripheral distribution predilection. There are now no round circumscribed nodules for spiculated nodules. In the right upper lobe, anteriorly on image 65, there is a 0.8 cm indeterminate anterior pleural based opacity. 3.9 cm indeterminate wedge-shaped opacity in the right middle  lobe on image 76. Small pleural based lateral right lower lobe opacity measures 10 mm. There are primarily patchy opacities towards the medial left apex. 1.6 cm anterior left upper lobe indeterminate opacity. Minimal linear and patchy opacity at the medial left base. Upper Abdomen: No acute abnormality. Musculoskeletal: Mild wedging of a mid level thoracic vertebral body has a chronic appearance. IMPRESSION: Multiple indeterminate and patchy pulmonary opacities are seen bilaterally. There is a peripheral prep collection and some are wedge-shaped. These findings are nonspecific and malignancy is not excluded. Differential diagnosis also includes multiple infarcts, eosinophilic pneumonia, and organizing pneumonia. Three-month follow-up CT is recommended to ensure resolution of these findings. If findings persist, PET can be performed to further evaluate. Initial follow-up by chest CT without contrast is recommended in 3 months to confirm persistence. This recommendation follows the consensus statement: Recommendations for the Management of Subsolid Pulmonary Nodules Detected at CT: A Statement from the [facility] as published in Radiology 2013; 266:304-317. Emphysema Ascending aortic aneurysm measures 4.7 cm in diameter. Ascending thoracic aortic aneurysm. Recommend semi-annual imaging followup by CTA or MRA and  referral to cardiothoracic surgery if not already obtained. This recommendation follows 2010 ACCF/AHA/AATS/ACR/ASA/SCA/SCAI/SIR/STS/SVM Guidelines for the Diagnosis and Management of Patients With Thoracic Aortic Disease. Circulation. 2010; 121: O962-X528 Electronically Signed   By: Marybelle Killings M.D.   On: 09/02/2016 10:40    Discussion She has symptoms suggestive of recent respiratory infection.  She had imaging studies that showed b/l peripheral opacities in background of extensive emphysema.  She didn't feel clinical improvement with short course of prednisone, but did have improvement after  recent antibiotic therapy.  It is unclear what the cause of these infiltrates are.   Assessment/plan  Interstitial lung disease with b/l peripheral based pulmonary infiltrates. - will get additional lab work with CBC with diff, IgE, ANA, ANCA, RF, CCP, Scl 70, SSa/SSb - depending on lab findings will determine if she needs additional intervention with bronchoscopy and BAL - since she has clinical improvement after recent antibiotic therapy, if her lab assessment is unrevealing it might be sufficient to have serial imaging studies with f/u CT chest in July 2018  COPD with emphysema. - advised her to start using Breo - will send script for prn albuterol - will arrange for overnight oximetry on room air - she will f/u with her PCP for vaccinations   Patient Instructions  Lab tests today  Will arrange for overnight oxygen test  Follow up in 4 weeks with Dr. Halford Chessman or Nurse Practitioner      Chesley Mires, MD [facility]Critical [facility]/Sleep Pager:  [phone] 09/13/2016, 10:10 AM

## 2016-09-14 LAB — ANA W/REFLEX IF POSITIVE
Anti Nuclear Antibody(ANA): POSITIVE — AB
Centromere Ab Screen: <0.2 AI (ref 0.0–0.9)
ENA RNP Ab: 0.3 AI (ref 0.0–0.9)
ENA SM Ab Ser-aCnc: <0.2 AI (ref 0.0–0.9)
ENA SSA (RO) Ab: 2.6 AI — ABNORMAL HIGH (ref 0.0–0.9)
ENA SSB (LA) Ab: <0.2 AI (ref 0.0–0.9)
dsDNA Ab: 2 IU/mL (ref 0–9)

## 2016-09-14 LAB — ANTI-SCLERODERMA ANTIBODY: Scleroderma (Scl-70) (ENA) Antibody, IgG: <1

## 2016-09-14 LAB — IGE: IGE (IMMUNOGLOBULIN E), SERUM: 243 kU/L — AB (ref <115)

## 2016-09-14 LAB — CYCLIC CITRUL PEPTIDE ANTIBODY, IGG: Cyclic Citrullin Peptide Ab: <16 Units

## 2016-09-14 LAB — RHEUMATOID FACTOR

## 2016-09-15 ENCOUNTER — Other Ambulatory Visit: Payer: PPO

## 2016-09-15 LAB — CBC WITH DIFFERENTIAL/PLATELET
BASOS ABS: 0.1 10*3/uL (ref 0.0–0.1)
Basophils Relative: 1.1 % (ref 0.0–3.0)
Eosinophils Absolute: 0.1 10*3/uL (ref 0.0–0.7)
Eosinophils Relative: 0.9 % (ref 0.0–5.0)
HEMATOCRIT: 37.5 % (ref 36.0–46.0)
HEMOGLOBIN: 12.6 g/dL (ref 12.0–15.0)
Lymphocytes Relative: 25.7 % (ref 12.0–46.0)
Lymphs Abs: 1.6 10*3/uL (ref 0.7–4.0)
MCHC: 33.6 g/dL (ref 30.0–36.0)
MCV: 97.2 fl (ref 78.0–100.0)
Monocytes Absolute: 0.9 10*3/uL (ref 0.1–1.0)
Monocytes Relative: 14.4 % — ABNORMAL HIGH (ref 3.0–12.0)
NEUTROS ABS: 3.6 10*3/uL (ref 1.4–7.7)
Neutrophils Relative %: 57.9 % (ref 43.0–77.0)
PLATELETS: 441 10*3/uL — AB (ref 150.0–400.0)
RBC: 3.86 Mil/uL — ABNORMAL LOW (ref 3.87–5.11)
RDW: 13.2 % (ref 11.5–15.5)
WBC: 6.2 10*3/uL (ref 4.0–10.5)

## 2016-09-15 LAB — ANCA SCREEN W REFLEX TITER: ANCA Screen: POSITIVE — AB

## 2016-09-15 LAB — RFLX P-ANCA TITER: P-ANCA: 1:20 {titer} — ABNORMAL HIGH

## 2016-09-28 ENCOUNTER — Encounter: Payer: Self-pay | Admitting: Pulmonary Disease

## 2016-09-28 DIAGNOSIS — J449 Chronic obstructive pulmonary disease, unspecified: Secondary | ICD-10-CM | POA: Diagnosis not present

## 2016-10-03 ENCOUNTER — Telehealth: Payer: Self-pay | Admitting: Pulmonary Disease

## 2016-10-03 DIAGNOSIS — J849 Interstitial pulmonary disease, unspecified: Secondary | ICD-10-CM

## 2016-10-03 NOTE — Telephone Encounter (Signed)
Called and lmom for Christie Peters with [facility] to make her aware of order for ONO that has been placed.

## 2016-10-03 NOTE — Telephone Encounter (Signed)
CBC Latest Ref Rng & Units 09/13/2016 10/01/2014 10/18/2010  WBC 4.0 - 10.5 K/uL 6.2 - 4.5  Hemoglobin 12.0 - 15.0 g/dL 12.6 13.2 13.8  Hematocrit 36.0 - 46.0 % 37.5 - 40.4  Platelets 150.0 - 400.0 K/uL 441.0(H) - 262.0    Serology 09/13/16 >> IgE 243, RF negative, anti CCP negative, SCL 70 negative, ANA positive, SSA 2.6, SSB negative, P ANCA positive 1:20   Spoke with pt.  She reports respiratory symptoms are improved, but still has fatigue.  Uncertain about significant of elevated SSA Ab, and mild elevation in P ANCA.  Will have this reviewed in more detail at [facility] on 10/12/16 with Tammy Parrett.  She will need repeat CXR then.  Will then decide if she needs referral to rheumatology, trial of steroids, and/or bronchoscopy.

## 2016-10-03 NOTE — Telephone Encounter (Signed)
Please have this repeated.

## 2016-10-03 NOTE — Telephone Encounter (Signed)
Per Melissa- ONO was done but only have 43 min of information  Do you want to have this repeated? Please advise, thanks!

## 2016-10-09 ENCOUNTER — Encounter: Payer: Self-pay | Admitting: Pulmonary Disease

## 2016-10-09 DIAGNOSIS — J449 Chronic obstructive pulmonary disease, unspecified: Secondary | ICD-10-CM | POA: Diagnosis not present

## 2016-10-10 DIAGNOSIS — J449 Chronic obstructive pulmonary disease, unspecified: Secondary | ICD-10-CM | POA: Diagnosis not present

## 2016-10-12 ENCOUNTER — Telehealth: Payer: Self-pay | Admitting: Pulmonary Disease

## 2016-10-12 NOTE — Telephone Encounter (Signed)
ONO with RA 09/28/16 >> test time 43 min.  Average SpO2 90%, low SpO2 85%.  Spent 7 min 40 sec with SpO2 < 88%.  Please inform pt that ONO shows borderline low oxygen, but limited test time.  Will call her back once repeat ONO available.

## 2016-10-13 ENCOUNTER — Ambulatory Visit: Payer: PPO | Admitting: Adult Health

## 2016-10-13 NOTE — Telephone Encounter (Signed)
LMTCB

## 2016-10-17 ENCOUNTER — Ambulatory Visit (INDEPENDENT_AMBULATORY_CARE_PROVIDER_SITE_OTHER): Payer: PPO | Admitting: Adult Health

## 2016-10-17 ENCOUNTER — Encounter: Payer: Self-pay | Admitting: Adult Health

## 2016-10-17 DIAGNOSIS — J449 Chronic obstructive pulmonary disease, unspecified: Secondary | ICD-10-CM | POA: Diagnosis not present

## 2016-10-17 DIAGNOSIS — J849 Interstitial pulmonary disease, unspecified: Secondary | ICD-10-CM | POA: Insufficient documentation

## 2016-10-17 NOTE — Progress Notes (Signed)
@Patient  ID: Christie Peters, female    DOB: Feb 14, 1936, 81 y.o.   MRN: [id number]  Chief Complaint  Patient presents with  . Follow-up    ILD     Referring provider: Leanna Battles, MD  HPI: 81 year old female former smoker seen for pulmonary consult 09/13/2016 for abnormal CT chest and dyspnea  TEST  PFTs 2017  showed severe obstruction, air trapping, severe diffusion defect, and bronchodilatory responsiveness.  (FEV1 42%, ratio 49, FVC 65%, positive bronchodilator response, DLCO 21%) Autoimmune/CTD 08/2016 neg  ONO with RA 09/28/16 >> test time 43 min.  Average SpO2 90%, low SpO2 85%.  Spent 7 min 40 sec with SpO2 < 88%.>repeat ONO penidng  CT chest 09/02/2016 showed multiple pulmonary opacities bilaterally and emphysema  Serology 09/13/16 >> IgE 243, RF negative, anti CCP negative, SCL 70 negative, ANA positive, SSA 2.6, SSB negative, P ANCA positive 1:20  10/17/2016 Follow up: ILD w/ pulmonary infiltrates , Emphysema  Patient returns for a two-week follow-up. Patient was seen last visit for pulmonary consult for abnormal CT chest. Patient was found to have multiple pulmonary nodules bilaterally and considerable emphysema. Patient was felt to have some ILD-type changes. Labs showed positive p-ANCA and ANA positive. Patient was started on BREO but did not take. She misunderstood the instructions . She agrees to try   Had repeat ONO , results are pending .  Denies joint pains or rash .  Says overall breathing is okay . Gets winded with prolonged walking , inclines . No flare of cough or wheezing .      Allergies  Allergen Reactions  . Levofloxacin Itching and Rash    Immunization History  Administered Date(s) Administered  . H1N1 06/09/2008  . Influenza Whole 02/19/2009  . Influenza,inj,Quad PF,36+ Mos 02/20/2016  . Td 09/07/2006    Past Medical History:  Diagnosis Date  . Dental crowns present   . Dupuytren's contracture of left hand 09/2014   left middle finger  .  Hyperlipidemia   . Hypertension    states under control with meds., has been on med. x 2 yr.    Tobacco History: History  Smoking Status  . Former Smoker  . Packs/day: 0.50  . Years: 60.00  . Quit date: 09/18/2013  Smokeless Tobacco  . Never Used    Comment: pt smoked off and on for about 60 years, reports stopping several times and then starting back   Counseling given: Not Answered   Outpatient Encounter Prescriptions as of 10/17/2016  Medication Sig  . albuterol (PROVENTIL HFA;VENTOLIN HFA) 108 (90 Base) MCG/ACT inhaler Inhale 2 puffs into the lungs every 6 (six) hours as needed for wheezing or shortness of breath.  . Calcium Carbonate-Vitamin D (CALTRATE 600+D PO) Take by mouth.  . cholecalciferol (VITAMIN D) 1000 UNITS tablet Take 2,000 Units by mouth daily.  . fluticasone furoate-vilanterol (BREO ELLIPTA) 100-25 MCG/INH AEPB Inhale 1 puff into the lungs daily.  . hydrochlorothiazide (HYDRODIURIL) 25 MG tablet TAKE ONE TABLET BY MOUTH EVERY DAY  . pravastatin (PRAVACHOL) 40 MG tablet Take 1 tablet by mouth daily.   No facility-administered encounter medications on file as of 10/17/2016.      Review of Systems  Constitutional:   No  weight loss, night sweats,  Fevers, chills,  +fatigue, or  lassitude.  HEENT:   No headaches,  Difficulty swallowing,  Tooth/dental problems, or  Sore throat,                No sneezing, itching,  ear ache, nasal congestion, post nasal drip,   CV:  No chest pain,  Orthopnea, PND, swelling in lower extremities, anasarca, dizziness, palpitations, syncope.   GI  No heartburn, indigestion, abdominal pain, nausea, vomiting, diarrhea, change in bowel habits, loss of appetite, bloody stools.   Resp:    No chest wall deformity  Skin: no rash or lesions.  GU: no dysuria, change in color of urine, no urgency or frequency.  No flank pain, no hematuria   MS:  No joint pain or swelling.  No decreased range of motion.  No back pain.    Physical  Exam  BP 110/80 (BP Location: Left Arm, Cuff Size: Normal)   Pulse 96   Resp 18   Ht 5\' 6"  (1.676 m)   Wt 130 lb 3.2 oz (59.1 kg)   SpO2 100%   BMI 21.01 kg/m   GEN: A/Ox3; pleasant , NAD,  Elderly     HEENT:  /AT,  EACs-clear, TMs-wnl, NOSE-clear, THROAT-clear, no lesions, no postnasal drip or exudate noted.   NECK:  Supple w/ fair ROM; no JVD; normal carotid impulses w/o bruits; no thyromegaly or nodules palpated; no lymphadenopathy.    RESP  Clear  P & A; w/o, wheezes/ rales/ or rhonchi. no accessory muscle use, no dullness to percussion  CARD:  RRR, no m/r/g, tr  peripheral edema, pulses intact, no cyanosis or clubbing.  GI:   Soft & nt; nml bowel sounds; no organomegaly or masses detected.   Musco: Warm bil, no deformities or joint swelling noted.   Neuro: alert, no focal deficits noted.    Skin: Warm, no lesions or rashes    Lab Results:  CBC    Component Value Date/Time   WBC 6.2 09/13/2016 1018   RBC 3.86 (L) 09/13/2016 1018   HGB 12.6 09/13/2016 1018   HCT 37.5 09/13/2016 1018   PLT 441.0 (H) 09/13/2016 1018   MCV 97.2 09/13/2016 1018   MCHC 33.6 09/13/2016 1018   RDW 13.2 09/13/2016 1018   LYMPHSABS 1.6 09/13/2016 1018   MONOABS 0.9 09/13/2016 1018   EOSABS 0.1 09/13/2016 1018   BASOSABS 0.1 09/13/2016 1018    BMET    Component Value Date/Time   NA 137 09/30/2014 1315   K 4.2 09/30/2014 1315   CL 96 (L) 09/30/2014 1315   CO2 33 (H) 09/30/2014 1315   GLUCOSE 106 (H) 09/30/2014 1315   GLUCOSE 119 (H) 04/30/2006 1232   BUN 14 09/30/2014 1315   CREATININE 0.67 09/30/2014 1315   CALCIUM 10.0 09/30/2014 1315   GFRNONAA >60 09/30/2014 1315   GFRAA >60 09/30/2014 1315    BNP No results found for: BNP  ProBNP No results found for: PROBNP  Imaging: No results found.   Assessment & Plan:   COPD with chronic bronchitis and emphysema ([place]) Severe COPD - Will need to begin BREO  Fu on ONO .   Plan  Patient Instructions  Refer to  rheumatology .  Once ONO test results are back will call you.  Set up for CT chest in July/August 2018 to follow lung nodules .  Begin BREO 1 puff daily , rinse after use .  Follow up Dr. Halford Chessman  In 3 months and As needed   Please contact office for sooner follow up if symptoms do not improve or worsen or seek emergency care       ILD (interstitial lung disease) ([place]) ILD changes w/ nodular changes on CT chest  P ANCA and ANA  positive  Will ned follow up CT chest in 3 months to follow  Refer to rheumatology .  Hold on steroids as this time as very little symptoms other than dyspnea, no rash /joint pain or much cough .   Plan  Patient Instructions  Refer to rheumatology .  Once ONO test results are back will call you.  Set up for CT chest in July/August 2018 to follow lung nodules .  Begin BREO 1 puff daily , rinse after use .  Follow up Dr. Halford Chessman  In 3 months and As needed   Please contact office for sooner follow up if symptoms do not improve or worsen or seek emergency care          Rexene Edison, NP 10/17/2016

## 2016-10-17 NOTE — Assessment & Plan Note (Signed)
ILD changes w/ nodular changes on CT chest  P ANCA and ANA positive  Will ned follow up CT chest in 3 months to follow  Refer to rheumatology .  Hold on steroids as this time as very little symptoms other than dyspnea, no rash /joint pain or much cough .   Plan  Patient Instructions  Refer to rheumatology .  Once ONO test results are back will call you.  Set up for CT chest in July/August 2018 to follow lung nodules .  Begin BREO 1 puff daily , rinse after use .  Follow up Dr. Halford Chessman  In 3 months and As needed   Please contact office for sooner follow up if symptoms do not improve or worsen or seek emergency care

## 2016-10-17 NOTE — Progress Notes (Signed)
I have reviewed and agree with assessment/plan.  Chesley Mires, MD [facility]/Critical Care 10/17/2016, 11:51 PM Pager:  [phone]

## 2016-10-17 NOTE — Assessment & Plan Note (Addendum)
Severe COPD - Will need to begin BREO  Fu on ONO .   Plan  Patient Instructions  Refer to rheumatology .  Once ONO test results are back will call you.  Set up for CT chest in July/August 2018 to follow lung nodules .  Begin BREO 1 puff daily , rinse after use .  Follow up Dr. Halford Chessman  In 3 months and As needed   Please contact office for sooner follow up if symptoms do not improve or worsen or seek emergency care

## 2016-10-17 NOTE — Patient Instructions (Addendum)
Refer to rheumatology .  Once ONO test results are back will call you.  Set up for CT chest in July/August 2018 to follow lung nodules .  Begin BREO 1 puff daily , rinse after use .  Follow up Dr. Halford Chessman  In 3 months and As needed   Please contact office for sooner follow up if symptoms do not improve or worsen or seek emergency care

## 2016-10-20 ENCOUNTER — Telehealth: Payer: Self-pay | Admitting: Adult Health

## 2016-10-20 DIAGNOSIS — J449 Chronic obstructive pulmonary disease, unspecified: Secondary | ICD-10-CM

## 2016-10-20 DIAGNOSIS — J849 Interstitial pulmonary disease, unspecified: Secondary | ICD-10-CM

## 2016-10-20 NOTE — Telephone Encounter (Signed)
Pt seen by TP 5.29.18 to discuss labs and repeat ONO results ONO results received from [facility]  Per TP: O2 sats drop when sleeping.  Begin O2 at bedtime 2lpm  Called spoke with patient, discussed ONO results/recs as stated by TP Pt voiced her understanding Order placed to [facility] Nothing further needed; will sign off

## 2016-10-23 DIAGNOSIS — J449 Chronic obstructive pulmonary disease, unspecified: Secondary | ICD-10-CM | POA: Diagnosis not present

## 2016-10-23 DIAGNOSIS — J849 Interstitial pulmonary disease, unspecified: Secondary | ICD-10-CM | POA: Diagnosis not present

## 2016-10-24 DIAGNOSIS — M810 Age-related osteoporosis without current pathological fracture: Secondary | ICD-10-CM | POA: Diagnosis not present

## 2016-10-26 DIAGNOSIS — I7389 Other specified peripheral vascular diseases: Secondary | ICD-10-CM | POA: Diagnosis not present

## 2016-10-26 DIAGNOSIS — Z1389 Encounter for screening for other disorder: Secondary | ICD-10-CM | POA: Diagnosis not present

## 2016-10-26 DIAGNOSIS — J449 Chronic obstructive pulmonary disease, unspecified: Secondary | ICD-10-CM | POA: Diagnosis not present

## 2016-10-26 DIAGNOSIS — Z6821 Body mass index (BMI) 21.0-21.9, adult: Secondary | ICD-10-CM | POA: Diagnosis not present

## 2016-10-27 NOTE — Telephone Encounter (Signed)
Results have been explained to patient, pt expressed understanding.  Pt saw TP on 10/17/16 and states that she is wanting to follow up with Dr Halford Chessman ASAP Pt frustrated and states that she did not feel as though all of her questions were adequately answered and she had other issues with the appt that I advised that she could discuss with Dr Halford Chessman at the follow up. Pt requests to not be scheduled with an NP again as she ONLY wants to see Dr Halford Chessman in the future.   -Please advise Dr Halford Chessman on when you want to try and work this patient in for an [place] - pt requests ASAP -Please advise on labs - pt refused the Rheum referral as she stated that she never got the lab results.  -Patient did not get a repeat CXR at the 10/12/16 visit with TP, does the patient still need this?  -Per AVS, pt was to be scheduled for CT in July/August2018  but this does not look to have been ordered.   Please advise Dr Halford Chessman of what all you want to me do. Thanks.   Assessment & Plan:   COPD with chronic bronchitis and emphysema ([place]) Severe COPD - Will need to begin BREO  Fu on ONO .   Plan  Patient Instructions  Refer to rheumatology .  Once ONO test results are back will call you.  Set up for CT chest in July/August 2018 to follow lung nodules .  Begin BREO 1 puff daily , rinse after use .  Follow up Dr. Halford Chessman  In 3 months and As needed   Please contact office for sooner follow up if symptoms do not improve or worsen or seek emergency care

## 2016-10-30 NOTE — Telephone Encounter (Signed)
Left message for patient to contact office to schedule ROV.

## 2016-10-30 NOTE — Telephone Encounter (Signed)
Double book her with me sometime this month.  Needs 15 minute time slot.

## 2016-10-31 NOTE — Telephone Encounter (Signed)
Was able to schedule pt with VS on June 18. Pt had no further questions at this time. Nothing further is needed

## 2016-10-31 NOTE — Telephone Encounter (Signed)
Pt returning call and can be reached @ [phone].Christie Peters

## 2016-10-31 NOTE — Telephone Encounter (Signed)
lmtcb x2 for for pt.

## 2016-11-02 ENCOUNTER — Other Ambulatory Visit: Payer: Self-pay

## 2016-11-02 DIAGNOSIS — I739 Peripheral vascular disease, unspecified: Secondary | ICD-10-CM

## 2016-11-06 ENCOUNTER — Ambulatory Visit (INDEPENDENT_AMBULATORY_CARE_PROVIDER_SITE_OTHER): Payer: PPO | Admitting: Pulmonary Disease

## 2016-11-06 ENCOUNTER — Encounter: Payer: Self-pay | Admitting: Pulmonary Disease

## 2016-11-06 VITALS — BP 120/72 | HR 71 | Ht 66.5 in | Wt 131.2 lb

## 2016-11-06 DIAGNOSIS — G4734 Idiopathic sleep related nonobstructive alveolar hypoventilation: Secondary | ICD-10-CM | POA: Diagnosis not present

## 2016-11-06 DIAGNOSIS — R918 Other nonspecific abnormal finding of lung field: Secondary | ICD-10-CM | POA: Diagnosis not present

## 2016-11-06 DIAGNOSIS — J432 Centrilobular emphysema: Secondary | ICD-10-CM

## 2016-11-06 NOTE — Patient Instructions (Signed)
Will schedule CT chest  Follow up in 6 months

## 2016-11-06 NOTE — Progress Notes (Signed)
Current Outpatient Prescriptions on File Prior to Visit  Medication Sig  . albuterol (PROVENTIL HFA;VENTOLIN HFA) 108 (90 Base) MCG/ACT inhaler Inhale 2 puffs into the lungs every 6 (six) hours as needed for wheezing or shortness of breath.  . Calcium Carbonate-Vitamin D (CALTRATE 600+D PO) Take by mouth.  . cholecalciferol (VITAMIN D) 1000 UNITS tablet Take 2,000 Units by mouth daily.  . fluticasone furoate-vilanterol (BREO ELLIPTA) 100-25 MCG/INH AEPB Inhale 1 puff into the lungs daily.  . hydrochlorothiazide (HYDRODIURIL) 25 MG tablet TAKE ONE TABLET BY MOUTH EVERY DAY  . pravastatin (PRAVACHOL) 40 MG tablet Take 1 tablet by mouth daily.   No current facility-administered medications on file prior to visit.      Chief Complaint  Patient presents with  . Follow-up    Follow-Up from visit with TP on 10/17/16 - review results     Tests PFT 01/14/16 >> FEV1 0.97 (42%), FEV1% 49, TLC 6.64 (122%), RV 5.08 (205%), DLCO 21%, +BD CT chest 09/02/16 >> atherosclerosis, 4.7 cm ascending aorta, centrilobular emphysema, patchy and peripheral opacities Serology 09/13/16 >> IgE 243, RF negative, anti CCP negative, SCL 70 negative, ANA positive, SSA 2.6, SSB negative, P ANCA positive 1:20  Past medical history HTN, HLD  Past surgical history, Family history, Social history, Allergies all reviewed.  Vital Signs BP 120/72 (BP Location: Right Arm, Cuff Size: Normal)   Pulse 71   Ht 5' 6.5" (1.689 m)   Wt 131 lb 3.2 oz (59.5 kg)   SpO2 94%   BMI 20.86 kg/m   History of Present Illness Christie Peters is a 81 y.o. female former smoker with COPD/emphysema and pulmonary infiltrates.  Her breathing is better  She is not having cough, wheeze, sputum, or fever.  She denies chest pain.  She is not having dry eyes, difficulty swallowing, dry mouth, or skin rash.  She is going to seen Dr. Oneida Alar for leg pains with activity.  She was started on 2 liters oxygen at night.  She has albuterol, but  doesn't use much.   Physical Exam  General - pleasant Eyes - pupils reactive ENT - no sinus tenderness, no oral exudate, no LAN Cardiac - regular, no murmur Chest - no wheeze, rales Abd - soft, non tender Ext - no edema Skin - dusky hue of lower extremities Neuro - normal strength Psych - normal mood  Assessment/Plan  COPD with emphysema. - continue prn albuterol - f/u with PCP for vaccinations  Sleep related hypoxia. - continue 2 liters oxygen at night - asked her to monitor her sleep pattern  Pulmonary infiltrates. - clinically improved after Abx therapy - has mild elevation in SSA, and ANCA >> uncertain significance - f/u CT chest w/o contrast - if infiltrates persist, then will need bronchoscopy +/- rheumatology assessment   Patient Instructions  Will schedule CT chest  Follow up in 6 months    Chesley Mires, MD [facility] Pager:  [phone] 11/06/2016, 10:07 AM

## 2016-11-07 ENCOUNTER — Other Ambulatory Visit: Payer: Self-pay | Admitting: Vascular Surgery

## 2016-11-07 DIAGNOSIS — I739 Peripheral vascular disease, unspecified: Secondary | ICD-10-CM

## 2016-11-20 DIAGNOSIS — Z9841 Cataract extraction status, right eye: Secondary | ICD-10-CM | POA: Diagnosis not present

## 2016-11-20 DIAGNOSIS — H18459 Nodular corneal degeneration, unspecified eye: Secondary | ICD-10-CM | POA: Diagnosis not present

## 2016-11-20 DIAGNOSIS — H26491 Other secondary cataract, right eye: Secondary | ICD-10-CM | POA: Diagnosis not present

## 2016-11-20 DIAGNOSIS — Z9842 Cataract extraction status, left eye: Secondary | ICD-10-CM | POA: Diagnosis not present

## 2016-11-20 DIAGNOSIS — Z87891 Personal history of nicotine dependence: Secondary | ICD-10-CM | POA: Diagnosis not present

## 2016-11-20 DIAGNOSIS — Z961 Presence of intraocular lens: Secondary | ICD-10-CM | POA: Diagnosis not present

## 2016-11-20 DIAGNOSIS — Z79899 Other long term (current) drug therapy: Secondary | ICD-10-CM | POA: Diagnosis not present

## 2016-11-20 DIAGNOSIS — J449 Chronic obstructive pulmonary disease, unspecified: Secondary | ICD-10-CM | POA: Diagnosis not present

## 2016-11-20 DIAGNOSIS — Z881 Allergy status to other antibiotic agents status: Secondary | ICD-10-CM | POA: Diagnosis not present

## 2016-11-20 DIAGNOSIS — Z9889 Other specified postprocedural states: Secondary | ICD-10-CM | POA: Diagnosis not present

## 2016-11-20 DIAGNOSIS — Z7982 Long term (current) use of aspirin: Secondary | ICD-10-CM | POA: Diagnosis not present

## 2016-11-20 DIAGNOSIS — H18453 Nodular corneal degeneration, bilateral: Secondary | ICD-10-CM | POA: Diagnosis not present

## 2016-11-20 DIAGNOSIS — I10 Essential (primary) hypertension: Secondary | ICD-10-CM | POA: Diagnosis not present

## 2016-11-22 DIAGNOSIS — J449 Chronic obstructive pulmonary disease, unspecified: Secondary | ICD-10-CM | POA: Diagnosis not present

## 2016-11-22 DIAGNOSIS — J849 Interstitial pulmonary disease, unspecified: Secondary | ICD-10-CM | POA: Diagnosis not present

## 2016-12-04 ENCOUNTER — Ambulatory Visit (INDEPENDENT_AMBULATORY_CARE_PROVIDER_SITE_OTHER)
Admission: RE | Admit: 2016-12-04 | Discharge: 2016-12-04 | Disposition: A | Discharge status: Home / Self Care | Payer: PPO | Source: Ambulatory Visit | Attending: Pulmonary Disease | Admitting: Pulmonary Disease

## 2016-12-04 DIAGNOSIS — R918 Other nonspecific abnormal finding of lung field: Secondary | ICD-10-CM | POA: Diagnosis not present

## 2016-12-04 DIAGNOSIS — J439 Emphysema, unspecified: Secondary | ICD-10-CM | POA: Diagnosis not present

## 2016-12-04 DIAGNOSIS — I712 Thoracic aortic aneurysm, without rupture: Secondary | ICD-10-CM | POA: Diagnosis not present

## 2016-12-05 ENCOUNTER — Telehealth: Payer: Self-pay | Admitting: Pulmonary Disease

## 2016-12-05 NOTE — Telephone Encounter (Signed)
Spoke with patient and informed her of results and recommendations. Pt verbalized understanding and did not have any questions. Nothing further is needed.

## 2016-12-05 NOTE — Telephone Encounter (Signed)
CT chest 12/04/16 >> 4.7 cm ascending aorta, apical scarring, centrilobular and paraseptal emphysema, much improved patchy ASD with mild residual BTX in RML/RLL   Will have my nurse inform pt that CT chest looks much better compared to 09/02/16.  No change to current tx plan.

## 2016-12-08 ENCOUNTER — Encounter: Payer: Self-pay | Admitting: Vascular Surgery

## 2016-12-14 ENCOUNTER — Encounter: Payer: Self-pay | Admitting: Vascular Surgery

## 2016-12-14 ENCOUNTER — Ambulatory Visit (INDEPENDENT_AMBULATORY_CARE_PROVIDER_SITE_OTHER): Payer: PPO | Admitting: Vascular Surgery

## 2016-12-14 ENCOUNTER — Ambulatory Visit (HOSPITAL_COMMUNITY)
Admission: RE | Admit: 2016-12-14 | Discharge: 2016-12-14 | Disposition: A | Discharge status: Home / Self Care | Payer: PPO | Source: Ambulatory Visit | Attending: Vascular Surgery | Admitting: Vascular Surgery

## 2016-12-14 VITALS — BP 109/65 | HR 88 | Temp 98.2°F | Resp 16 | Ht 66.5 in | Wt 132.0 lb

## 2016-12-14 DIAGNOSIS — I739 Peripheral vascular disease, unspecified: Secondary | ICD-10-CM | POA: Diagnosis not present

## 2016-12-14 DIAGNOSIS — I70213 Atherosclerosis of native arteries of extremities with intermittent claudication, bilateral legs: Secondary | ICD-10-CM | POA: Diagnosis not present

## 2016-12-14 NOTE — Progress Notes (Signed)
Referring Physician: Valetta Fuller, MD  Patient name: Christie Peters MRN: [id number] DOB: 12/19/1935 Sex: female  REASON FOR CONSULT: Right leg claudication  HPI: Christie Peters is a 81 y.o. female with about a 2 year history of cramping and tightness in her right calf when walking about 1-2 blocks. This has slowly gotten worse and is now to the point of about a one half block walking distance. The pain is away after stopping for about 5 minutes. She does not describe rest pain. She has no history of nonhealing wounds. The left leg has no symptoms. She denies prior history of atrial fibrillation. She is a former smoker but quit in 2015. She does have fairly significant COPD. She is wearing oxygen at night time. Other medical problems include hyperlipidemia and hypertension both of which are controlled.  Past Medical History:  Diagnosis Date  . Dental crowns present   . Dupuytren's contracture of left hand 09/2014   left middle finger  . Hyperlipidemia   . Hypertension    states under control with meds., has been on med. x 2 yr.   Past Surgical History:  Procedure Laterality Date  . BLEPHAROPLASTY Bilateral 02/17/2013   upper lid  . CATARACT EXTRACTION W/ INTRAOCULAR LENS  IMPLANT, BILATERAL    . CESAREAN SECTION    . COLONOSCOPY WITH PROPOFOL  03/21/2013  . FASCIOTOMY Left 10/01/2014   Procedure: FASCIOTOMY LEFT MIDDLE FINGER, FASCIOTOMY LEFT RING FINGER,  FASCIOTOMY LEFT SMALL FINGER ;  Surgeon: [name], MD;  Location: [facility];  Service: Orthopedics;  Laterality: Left;    Family History  Problem Relation Age of Onset  . Stroke Mother   . Diabetes Brother   . Heart disease Brother        CABG  . Breast cancer Sister   . Birth defects Sister     SOCIAL HISTORY: Social History   Social History  . Marital status: Married    Spouse name: N/A  . Number of children: N/A  . Years of education: N/A   Occupational History  . retired    Social  History Main Topics  . Smoking status: Former Smoker    Packs/day: 0.50    Years: 60.00    Quit date: 09/18/2013  . Smokeless tobacco: Never Used     Comment: pt smoked off and on for about 60 years, reports stopping several times and then starting back  . Alcohol use Yes     Comment: occasionally  . Drug use: No  . Sexual activity: Not on file   Other Topics Concern  . Not on file   Social History Narrative   4 children, one is decreased   Original from [place]   Exercise- no   Diet- she is careful w/ diet   Will spend 2 months in [place] this winter     Allergies  Allergen Reactions  . Levofloxacin Itching and Rash    Current Outpatient Prescriptions  Medication Sig Dispense Refill  . albuterol (PROVENTIL HFA;VENTOLIN HFA) 108 (90 Base) MCG/ACT inhaler Inhale 2 puffs into the lungs every 6 (six) hours as needed for wheezing or shortness of breath. 1 Inhaler 6  . benazepril (LOTENSIN) 20 MG tablet     . Calcium Carbonate-Vitamin D (CALTRATE 600+D PO) Take by mouth.    . cholecalciferol (VITAMIN D) 1000 UNITS tablet Take 2,000 Units by mouth daily.    . fluticasone furoate-vilanterol (BREO ELLIPTA) 100-25 MCG/INH AEPB Inhale 1 puff into  the lungs daily.    . hydrochlorothiazide (HYDRODIURIL) 25 MG tablet TAKE ONE TABLET BY MOUTH EVERY DAY 30 tablet 0  . pravastatin (PRAVACHOL) 40 MG tablet Take 1 tablet by mouth daily.     No current facility-administered medications for this visit.     ROS:   General:  No weight loss, Fever, chills  HEENT: No recent headaches, no nasal bleeding, no visual changes, no sore throat  Neurologic: No dizziness, blackouts, seizures. No recent symptoms of stroke or mini- stroke. No recent episodes of slurred speech, or temporary blindness.  Cardiac: No recent episodes of chest pain/pressure, no shortness of breath at rest.  + shortness of breath with exertion.  Denies history of atrial fibrillation or irregular heartbeat  Vascular: No  history of rest pain in feet.  + history of claudication.  No history of non-healing ulcer, No history of DVT   Pulmonary: + home oxygen, no productive cough, no hemoptysis,  + asthma or wheezing  Musculoskeletal:  [X]  Arthritis, [ ]  Low back pain,  [X]  Joint pain  Hematologic:No history of hypercoagulable state.  No history of easy bleeding.  No history of anemia  Gastrointestinal: No hematochezia or melena,  No gastroesophageal reflux, no trouble swallowing  Urinary: [ ]  chronic Kidney disease, [ ]  on HD - [ ]  MWF or [ ]  TTHS, [ ]  Burning with urination, [ ]  Frequent urination, [ ]  Difficulty urinating;   Skin: No rashes  Psychological: No history of anxiety,  No history of depression   Physical Examination  Vitals:   12/14/16 1418  BP: 109/65  Pulse: 88  Resp: 16  Temp: 98.2 F (36.8 C)  TempSrc: Oral  SpO2: 98%  Weight: 132 lb (59.9 kg)  Height: 5' 6.5" (1.689 m)    Body mass index is 20.99 kg/m.  General:  Alert and oriented, no acute distress HEENT: Normal Neck: No bruit or JVD Pulmonary: Clear to auscultation bilaterally Cardiac: Regular Rate and Rhythm without murmur Abdomen: Soft, non-tender, non-distended, no mass Skin: No rash or ulcer, scattered small varicosities both ankles and feet Extremity Pulses:  2+ radial, brachial, 1- 2+ femoral, 2+ left dorsalis pedis, absent right dorsalis pedis absent posterior tibial pulses bilaterally Musculoskeletal: No deformity or edema  Neurologic: Upper and lower extremity motor 5/5 and symmetric  DATA:  Patient had bilateral ABIs performed today which were 0.47 on the right 0.78 on the left monophasic waveforms  ASSESSMENT:  Right lower extremity claudication. Objective evidence of moderate PAD in the right leg mild in the left leg. Currently left leg is asymptomatic.   PLAN:  Discussed with the patient today options of graduated walking program of 30 minutes daily with hopes of improving symptoms versus possible  intervention for what most likely is right leg superficial femoral artery occlusion. I discussed with the patient today that she is currently not at risk of limb loss. She is going to try a walking program of 30 minutes daily as a first measure. She'll return in 3 months time to see if she has had any improvement. If not we will discuss further at that time whether or not she wishes to consider an intervention.   Ruta Hinds, MD [facility] Office: [phone] Pager: [phone]

## 2016-12-18 DIAGNOSIS — Z9842 Cataract extraction status, left eye: Secondary | ICD-10-CM | POA: Diagnosis not present

## 2016-12-18 DIAGNOSIS — H26493 Other secondary cataract, bilateral: Secondary | ICD-10-CM | POA: Diagnosis not present

## 2016-12-18 DIAGNOSIS — Z9841 Cataract extraction status, right eye: Secondary | ICD-10-CM | POA: Diagnosis not present

## 2016-12-18 DIAGNOSIS — H26491 Other secondary cataract, right eye: Secondary | ICD-10-CM | POA: Diagnosis not present

## 2016-12-18 DIAGNOSIS — H18459 Nodular corneal degeneration, unspecified eye: Secondary | ICD-10-CM | POA: Diagnosis not present

## 2016-12-21 NOTE — Addendum Note (Signed)
Addended by: Lianne Cure A on: 12/21/2016 09:13 AM   Modules accepted: Orders

## 2016-12-23 DIAGNOSIS — J849 Interstitial pulmonary disease, unspecified: Secondary | ICD-10-CM | POA: Diagnosis not present

## 2016-12-23 DIAGNOSIS — J449 Chronic obstructive pulmonary disease, unspecified: Secondary | ICD-10-CM | POA: Diagnosis not present

## 2017-01-23 DIAGNOSIS — J449 Chronic obstructive pulmonary disease, unspecified: Secondary | ICD-10-CM | POA: Diagnosis not present

## 2017-01-23 DIAGNOSIS — J849 Interstitial pulmonary disease, unspecified: Secondary | ICD-10-CM | POA: Diagnosis not present

## 2017-01-28 DIAGNOSIS — Z9889 Other specified postprocedural states: Secondary | ICD-10-CM | POA: Insufficient documentation

## 2017-01-29 DIAGNOSIS — Z9841 Cataract extraction status, right eye: Secondary | ICD-10-CM | POA: Diagnosis not present

## 2017-01-29 DIAGNOSIS — Z48810 Encounter for surgical aftercare following surgery on the sense organs: Secondary | ICD-10-CM | POA: Diagnosis not present

## 2017-01-29 DIAGNOSIS — H18459 Nodular corneal degeneration, unspecified eye: Secondary | ICD-10-CM | POA: Diagnosis not present

## 2017-01-29 DIAGNOSIS — H5369 Other night blindness: Secondary | ICD-10-CM | POA: Diagnosis not present

## 2017-01-29 DIAGNOSIS — Z961 Presence of intraocular lens: Secondary | ICD-10-CM | POA: Diagnosis not present

## 2017-01-29 DIAGNOSIS — Z9842 Cataract extraction status, left eye: Secondary | ICD-10-CM | POA: Diagnosis not present

## 2017-02-06 ENCOUNTER — Telehealth: Payer: Self-pay | Admitting: Pulmonary Disease

## 2017-02-06 NOTE — Telephone Encounter (Signed)
Spoke with pt, she states she would like a POC for her oxygen at night because she is traveling out of town for 8 days and she want something easy to carry for when she is going out of town. She will call me back after she calls her DME company, she wasn't sure who the company was.

## 2017-02-07 ENCOUNTER — Ambulatory Visit: Payer: PPO | Admitting: Pulmonary Disease

## 2017-02-08 NOTE — Telephone Encounter (Signed)
lmtcb x1 for pt. 

## 2017-02-09 NOTE — Telephone Encounter (Signed)
Called and spoke with pt and she stated that one was from ----g4 portable concentrator. She will call [employer] and see if they will let her rent a machine to use while on vacation.  She will let us know or have them send over the order. [facility] 1st [employer] medical--innogen oxus

## 2017-02-12 ENCOUNTER — Telehealth: Payer: Self-pay | Admitting: Pulmonary Disease

## 2017-02-12 NOTE — Telephone Encounter (Signed)
Spoke with pt, she states she spoke with [facility].and wants to make sure she has the right POC. I advised her that she should contact [employer] since they know more about the different models to make sure it is what she needs. She is traveling international and needs to take O2 to use at night. Pt understood and nothing further is needed.

## 2017-02-19 ENCOUNTER — Telehealth: Payer: Self-pay | Admitting: Pulmonary Disease

## 2017-02-19 NOTE — Telephone Encounter (Signed)
Spoke with pt. Pt is going on a cruise and the cruise line is needing a letter stating that she is fit for travel.  VS - please advise. Thanks!

## 2017-02-19 NOTE — Telephone Encounter (Signed)
Pt states she would like to include in the letter - her diagnosis and that she needs this letter because she needs nocturnal O2. Pt aware that the form she is the only one that needs to complete this.   Will send to VS to add this to the letter.

## 2017-02-20 NOTE — Telephone Encounter (Signed)
Spoke with patient. She dropped of a letter from [employer] that needs to be typed on our letterhead stating that she is fit for travel. She is needing this due to her having to use noctural O2. The letter must include her booking number name, details of her personal circumstance, any special equipment that will be required, all prescription medications patient is taking, physician's written confirmation that she is fit for travel and physician's statement defining her specific needs.   The letter also needs to be addressed to [facility].   I have the letter from the patient in triage.   VS, please advise if you are ok with providing this letter. Thanks!

## 2017-02-20 NOTE — Telephone Encounter (Signed)
Patient brought letter that needs to be completed by [employer] regarding cruise - patient willing to leave letter but would like to speak to nurse prior -pr

## 2017-02-22 ENCOUNTER — Encounter: Payer: Self-pay | Admitting: Pulmonary Disease

## 2017-02-22 DIAGNOSIS — J849 Interstitial pulmonary disease, unspecified: Secondary | ICD-10-CM | POA: Diagnosis not present

## 2017-02-22 DIAGNOSIS — J449 Chronic obstructive pulmonary disease, unspecified: Secondary | ICD-10-CM | POA: Diagnosis not present

## 2017-02-22 NOTE — Telephone Encounter (Signed)
Patient is following up on letter.

## 2017-02-22 NOTE — Telephone Encounter (Signed)
Spoke with pt and advised that letter was completed and faxed to number given for [employer].  Copy of letter was also left at front desk for pick up at pt's request.  Nothing further needed.

## 2017-02-22 NOTE — Telephone Encounter (Signed)
Letter completed.

## 2017-02-23 ENCOUNTER — Telehealth: Payer: Self-pay | Admitting: Pulmonary Disease

## 2017-02-23 NOTE — Telephone Encounter (Signed)
Called and spoke with patient today regarding that her letter to [employer]. I have faxed the letter to attn reservation dept [phone].   The pt verbalized understanding and denied any questions or concerns at this time. Patient was extremely thankful for all of the help from our office. She was super happy this has been completed. Advised patient that we will mail her a copy of the letter also today.

## 2017-03-05 DIAGNOSIS — M859 Disorder of bone density and structure, unspecified: Secondary | ICD-10-CM | POA: Diagnosis not present

## 2017-03-05 DIAGNOSIS — Z0000 Encounter for general adult medical examination without abnormal findings: Secondary | ICD-10-CM | POA: Diagnosis not present

## 2017-03-05 DIAGNOSIS — E7849 Other hyperlipidemia: Secondary | ICD-10-CM | POA: Diagnosis not present

## 2017-03-05 DIAGNOSIS — I10 Essential (primary) hypertension: Secondary | ICD-10-CM | POA: Diagnosis not present

## 2017-03-19 DIAGNOSIS — Z0000 Encounter for general adult medical examination without abnormal findings: Secondary | ICD-10-CM | POA: Diagnosis not present

## 2017-03-19 DIAGNOSIS — I7389 Other specified peripheral vascular diseases: Secondary | ICD-10-CM | POA: Diagnosis not present

## 2017-03-19 DIAGNOSIS — J449 Chronic obstructive pulmonary disease, unspecified: Secondary | ICD-10-CM | POA: Diagnosis not present

## 2017-03-19 DIAGNOSIS — I10 Essential (primary) hypertension: Secondary | ICD-10-CM | POA: Diagnosis not present

## 2017-03-19 DIAGNOSIS — R918 Other nonspecific abnormal finding of lung field: Secondary | ICD-10-CM | POA: Diagnosis not present

## 2017-03-19 DIAGNOSIS — M859 Disorder of bone density and structure, unspecified: Secondary | ICD-10-CM | POA: Diagnosis not present

## 2017-03-19 DIAGNOSIS — Z6821 Body mass index (BMI) 21.0-21.9, adult: Secondary | ICD-10-CM | POA: Diagnosis not present

## 2017-03-19 DIAGNOSIS — E7849 Other hyperlipidemia: Secondary | ICD-10-CM | POA: Diagnosis not present

## 2017-03-19 DIAGNOSIS — Z1389 Encounter for screening for other disorder: Secondary | ICD-10-CM | POA: Diagnosis not present

## 2017-03-22 ENCOUNTER — Ambulatory Visit (INDEPENDENT_AMBULATORY_CARE_PROVIDER_SITE_OTHER): Payer: PPO | Admitting: Vascular Surgery

## 2017-03-22 ENCOUNTER — Ambulatory Visit (HOSPITAL_COMMUNITY)
Admission: RE | Admit: 2017-03-22 | Discharge: 2017-03-22 | Disposition: A | Discharge status: Home / Self Care | Payer: PPO | Source: Ambulatory Visit | Attending: Vascular Surgery | Admitting: Vascular Surgery

## 2017-03-22 VITALS — BP 149/90 | HR 92 | Temp 98.6°F | Ht 66.5 in | Wt 135.0 lb

## 2017-03-22 DIAGNOSIS — I83811 Varicose veins of right lower extremities with pain: Secondary | ICD-10-CM

## 2017-03-22 DIAGNOSIS — I739 Peripheral vascular disease, unspecified: Secondary | ICD-10-CM

## 2017-03-22 DIAGNOSIS — R0989 Other specified symptoms and signs involving the circulatory and respiratory systems: Secondary | ICD-10-CM | POA: Diagnosis not present

## 2017-03-22 NOTE — Progress Notes (Signed)
Referring Physician: Valetta Fuller, MD   Patient name: Christie Peters      MRN: [id number]        DOB: 1935/06/29          Sex: female   REASON FOR CONSULT: Right leg claudication   HPI: Christie Peters is a 81 y.o. female with about a 2 year history of cramping and tightness in her right calf when walking about 1-2 blocks. She was last seen in July 2018. She states that her pain symptoms are about the same. She is trying to walk more. This has slowly gotten worse and is now to the point of about a one half block walking distance. The pain is away after stopping for about 5 minutes. She does not describe rest pain. She has no history of nonhealing wounds. The left leg has no symptoms.About one month ago she developed pain and swelling in her right medial calf. Her some warmth there as well.  She denies prior history of atrial fibrillation. She is a former smoker but quit in 2015. She does have fairly significant COPD. She is wearing oxygen at night time. Other medical problems include hyperlipidemia and hypertension both of which are controlled.       Past Medical History:  Diagnosis Date  . Dental crowns present    . Dupuytren's contracture of left hand 09/2014    left middle finger  . Hyperlipidemia    . Hypertension      states under control with meds., has been on med. x 2 yr.         Past Surgical History:  Procedure Laterality Date  . BLEPHAROPLASTY Bilateral 02/17/2013    upper lid  . CATARACT EXTRACTION W/ INTRAOCULAR LENS  IMPLANT, BILATERAL      . CESAREAN SECTION      . COLONOSCOPY WITH PROPOFOL   03/21/2013  . FASCIOTOMY Left 10/01/2014    Procedure: FASCIOTOMY LEFT MIDDLE FINGER, FASCIOTOMY LEFT RING FINGER,  FASCIOTOMY LEFT SMALL FINGER ;  Surgeon: [name], MD;  Location: [facility];  Service: Orthopedics;  Laterality: Left;           Family History  Problem Relation Age of Onset  . Stroke Mother    . Diabetes Brother    . Heart disease Brother          CABG  . Breast cancer Sister    . Birth defects Sister        SOCIAL HISTORY: Social History         Social History  . Marital status: Married      Spouse name: N/A  . Number of children: N/A  . Years of education: N/A        Occupational History  . retired            Social History Main Topics  . Smoking status: Former Smoker      Packs/day: 0.50      Years: 60.00      Quit date: 09/18/2013  . Smokeless tobacco: Never Used        Comment: pt smoked off and on for about 60 years, reports stopping several times and then starting back  . Alcohol use Yes        Comment: occasionally  . Drug use: No  . Sexual activity: Not on file        Other Topics Concern  . Not on file       Social History  Narrative    4 children, one is decreased    Original from [place]    Exercise- no    Diet- she is careful w/ diet    Will spend 2 months in [place] this winter           Allergies  Allergen Reactions  . Levofloxacin Itching and Rash            Current Outpatient Prescriptions  Medication Sig Dispense Refill  . albuterol (PROVENTIL HFA;VENTOLIN HFA) 108 (90 Base) MCG/ACT inhaler Inhale 2 puffs into the lungs every 6 (six) hours as needed for wheezing or shortness of breath. 1 Inhaler 6  . benazepril (LOTENSIN) 20 MG tablet        . Calcium Carbonate-Vitamin D (CALTRATE 600+D PO) Take by mouth.      . cholecalciferol (VITAMIN D) 1000 UNITS tablet Take 2,000 Units by mouth daily.      . fluticasone furoate-vilanterol (BREO ELLIPTA) 100-25 MCG/INH AEPB Inhale 1 puff into the lungs daily.      . hydrochlorothiazide (HYDRODIURIL) 25 MG tablet TAKE ONE TABLET BY MOUTH EVERY DAY 30 tablet 0  . pravastatin (PRAVACHOL) 40 MG tablet Take 1 tablet by mouth daily.        No current facility-administered medications for this visit.       ROS:    Cardiac: No recent episodes of chest pain/pressure, no shortness of breath at rest.  + shortness of breath with exertion.  Denies  history of atrial fibrillation or irregular heartbeat   Vascular: No history of rest pain in feet.  + history of claudication.  No history of non-healing ulcer, No history of DVT    Pulmonary: + home oxygen, no productive cough, no hemoptysis,  + asthma or wheezing      Physical Examination   Vitals:   03/22/17 1328  BP: (!) 149/90  Pulse: 92  Temp: 98.6 F (37 C)  TempSrc: Oral  SpO2: 98%  Weight: 135 lb (61.2 kg)  Height: 5' 6.5" (1.689 m)     General:  Alert and oriented, no acute distress Skin: No rash or ulcer, scattered small varicosities both ankles and feet, large varicosities greater saphenous vein medial aspect of calf bilaterally I to 6 mm in diameter, warmth with palpable cord in the greater saphenous and tributaries on the medial aspect of the right calf consistent with thrombophlebitis Extremity Pulses:  2+ radial, brachial, 1- 2+ femoral, 2+ left dorsalis pedis, absent right dorsalis pedis absent posterior tibial pulses bilaterally Musculoskeletal: No deformity trace pretibial edema      Neurologic: Upper and lower extremity motor 5/5 and symmetric   DATA:  Patient had bilateral ABIs performed today which were 0.99 on the left 0.72 on the right and biphasic on the right triphasic on the left. This is compared to previously 0.47 on the right 0.78 on the left   ASSESSMENT:  Right lower extremity claudication. Objective evidence of moderate PAD in the right leg mild in the left leg. Currently left leg is asymptomatic.     PLAN:  Discussed with the patient today options of graduated walking program of 30 minutes daily with hopes of improving symptoms versus possible intervention for what most likely is right leg superficial femoral artery occlusion. I discussed with the patient today that she is currently not at risk of limb loss. She is going to continue a walking program of 30 minutes daily as a first measure. She'll return in 6 months time to see if  she has had any  improvement. If not we will discuss further at that time whether or not she wishes to consider an intervention.  I discussed with her that any intervention currently would purely be for lifestyle issues. I again emphasized she is not at risk of limb loss.  Recent episode of thrombophlebitis right lower extremity. I discussed her today placing warm compresses and ice packs on this for comfort. She will also take ibuprofen for the next 3 days to decrease inflammation. If she continues to have multiple episodes of thrombophlebitis in the future we could consider whether or not she would be a candidate for laser ablation of her varicose veins.     Ruta Hinds, MD [facility] Office: [phone] Pager: [phone]

## 2017-03-25 DIAGNOSIS — J849 Interstitial pulmonary disease, unspecified: Secondary | ICD-10-CM | POA: Diagnosis not present

## 2017-03-25 DIAGNOSIS — J449 Chronic obstructive pulmonary disease, unspecified: Secondary | ICD-10-CM | POA: Diagnosis not present

## 2017-03-26 ENCOUNTER — Other Ambulatory Visit: Payer: Self-pay | Admitting: Internal Medicine

## 2017-03-26 DIAGNOSIS — Z1231 Encounter for screening mammogram for malignant neoplasm of breast: Secondary | ICD-10-CM

## 2017-03-30 DIAGNOSIS — H1789 Other corneal scars and opacities: Secondary | ICD-10-CM | POA: Diagnosis not present

## 2017-03-30 DIAGNOSIS — Z961 Presence of intraocular lens: Secondary | ICD-10-CM | POA: Diagnosis not present

## 2017-03-30 DIAGNOSIS — H5203 Hypermetropia, bilateral: Secondary | ICD-10-CM | POA: Diagnosis not present

## 2017-04-24 DIAGNOSIS — J449 Chronic obstructive pulmonary disease, unspecified: Secondary | ICD-10-CM | POA: Diagnosis not present

## 2017-04-24 DIAGNOSIS — J849 Interstitial pulmonary disease, unspecified: Secondary | ICD-10-CM | POA: Diagnosis not present

## 2017-04-26 ENCOUNTER — Ambulatory Visit
Admission: RE | Admit: 2017-04-26 | Discharge: 2017-04-26 | Disposition: A | Discharge status: Home / Self Care | Payer: PPO | Source: Ambulatory Visit | Attending: Internal Medicine | Admitting: Internal Medicine

## 2017-04-26 DIAGNOSIS — Z1231 Encounter for screening mammogram for malignant neoplasm of breast: Secondary | ICD-10-CM

## 2017-04-27 DIAGNOSIS — M810 Age-related osteoporosis without current pathological fracture: Secondary | ICD-10-CM | POA: Diagnosis not present

## 2017-05-25 ENCOUNTER — Other Ambulatory Visit: Payer: Self-pay | Admitting: Internal Medicine

## 2017-05-25 DIAGNOSIS — J849 Interstitial pulmonary disease, unspecified: Secondary | ICD-10-CM | POA: Diagnosis not present

## 2017-05-25 DIAGNOSIS — J449 Chronic obstructive pulmonary disease, unspecified: Secondary | ICD-10-CM | POA: Diagnosis not present

## 2017-05-25 DIAGNOSIS — R911 Solitary pulmonary nodule: Secondary | ICD-10-CM

## 2017-05-30 DIAGNOSIS — R69 Illness, unspecified: Secondary | ICD-10-CM | POA: Diagnosis not present

## 2017-06-01 ENCOUNTER — Other Ambulatory Visit: Payer: PPO

## 2017-06-08 ENCOUNTER — Ambulatory Visit
Admission: RE | Admit: 2017-06-08 | Discharge: 2017-06-08 | Disposition: A | Discharge status: Home / Self Care | Payer: Medicare HMO | Source: Ambulatory Visit | Attending: Internal Medicine | Admitting: Internal Medicine

## 2017-06-08 ENCOUNTER — Other Ambulatory Visit: Payer: PPO

## 2017-06-08 DIAGNOSIS — R911 Solitary pulmonary nodule: Secondary | ICD-10-CM

## 2017-06-08 DIAGNOSIS — R918 Other nonspecific abnormal finding of lung field: Secondary | ICD-10-CM | POA: Diagnosis not present

## 2017-06-25 DIAGNOSIS — J849 Interstitial pulmonary disease, unspecified: Secondary | ICD-10-CM | POA: Diagnosis not present

## 2017-06-25 DIAGNOSIS — J449 Chronic obstructive pulmonary disease, unspecified: Secondary | ICD-10-CM | POA: Diagnosis not present

## 2017-07-23 DIAGNOSIS — J449 Chronic obstructive pulmonary disease, unspecified: Secondary | ICD-10-CM | POA: Diagnosis not present

## 2017-07-23 DIAGNOSIS — J849 Interstitial pulmonary disease, unspecified: Secondary | ICD-10-CM | POA: Diagnosis not present

## 2017-08-23 DIAGNOSIS — J849 Interstitial pulmonary disease, unspecified: Secondary | ICD-10-CM | POA: Diagnosis not present

## 2017-08-23 DIAGNOSIS — J449 Chronic obstructive pulmonary disease, unspecified: Secondary | ICD-10-CM | POA: Diagnosis not present

## 2017-08-29 DIAGNOSIS — M810 Age-related osteoporosis without current pathological fracture: Secondary | ICD-10-CM | POA: Diagnosis not present

## 2017-08-29 DIAGNOSIS — M5030 Other cervical disc degeneration, unspecified cervical region: Secondary | ICD-10-CM | POA: Diagnosis not present

## 2017-09-03 ENCOUNTER — Other Ambulatory Visit: Payer: Self-pay

## 2017-09-03 DIAGNOSIS — I739 Peripheral vascular disease, unspecified: Secondary | ICD-10-CM

## 2017-09-05 DIAGNOSIS — M2560 Stiffness of unspecified joint, not elsewhere classified: Secondary | ICD-10-CM | POA: Diagnosis not present

## 2017-09-05 DIAGNOSIS — M542 Cervicalgia: Secondary | ICD-10-CM | POA: Diagnosis not present

## 2017-09-05 DIAGNOSIS — R293 Abnormal posture: Secondary | ICD-10-CM | POA: Diagnosis not present

## 2017-09-05 DIAGNOSIS — M5030 Other cervical disc degeneration, unspecified cervical region: Secondary | ICD-10-CM | POA: Diagnosis not present

## 2017-09-10 DIAGNOSIS — Z7722 Contact with and (suspected) exposure to environmental tobacco smoke (acute) (chronic): Secondary | ICD-10-CM | POA: Diagnosis not present

## 2017-09-10 DIAGNOSIS — E785 Hyperlipidemia, unspecified: Secondary | ICD-10-CM | POA: Diagnosis not present

## 2017-09-10 DIAGNOSIS — M810 Age-related osteoporosis without current pathological fracture: Secondary | ICD-10-CM | POA: Diagnosis not present

## 2017-09-10 DIAGNOSIS — I87329 Chronic venous hypertension (idiopathic) with inflammation of unspecified lower extremity: Secondary | ICD-10-CM | POA: Diagnosis not present

## 2017-09-10 DIAGNOSIS — Z809 Family history of malignant neoplasm, unspecified: Secondary | ICD-10-CM | POA: Diagnosis not present

## 2017-09-10 DIAGNOSIS — R69 Illness, unspecified: Secondary | ICD-10-CM | POA: Diagnosis not present

## 2017-09-10 DIAGNOSIS — Z803 Family history of malignant neoplasm of breast: Secondary | ICD-10-CM | POA: Diagnosis not present

## 2017-09-10 DIAGNOSIS — I10 Essential (primary) hypertension: Secondary | ICD-10-CM | POA: Diagnosis not present

## 2017-09-10 DIAGNOSIS — K5900 Constipation, unspecified: Secondary | ICD-10-CM | POA: Diagnosis not present

## 2017-09-10 DIAGNOSIS — Z79899 Other long term (current) drug therapy: Secondary | ICD-10-CM | POA: Diagnosis not present

## 2017-09-20 ENCOUNTER — Other Ambulatory Visit: Payer: Self-pay

## 2017-09-20 ENCOUNTER — Ambulatory Visit (HOSPITAL_COMMUNITY)
Admission: RE | Admit: 2017-09-20 | Discharge: 2017-09-20 | Disposition: A | Discharge status: Home / Self Care | Payer: Medicare HMO | Source: Ambulatory Visit | Attending: Vascular Surgery | Admitting: Vascular Surgery

## 2017-09-20 ENCOUNTER — Ambulatory Visit (INDEPENDENT_AMBULATORY_CARE_PROVIDER_SITE_OTHER): Payer: Medicare HMO | Admitting: Vascular Surgery

## 2017-09-20 ENCOUNTER — Encounter: Payer: Self-pay | Admitting: Vascular Surgery

## 2017-09-20 VITALS — BP 135/80 | HR 84 | Temp 97.9°F | Resp 16 | Ht 66.5 in | Wt 130.8 lb

## 2017-09-20 DIAGNOSIS — I739 Peripheral vascular disease, unspecified: Secondary | ICD-10-CM | POA: Diagnosis not present

## 2017-09-20 NOTE — Progress Notes (Signed)
Patient is a 82 year old female that returns for follow-up today regarding varicose veins and peripheral arterial disease.  She states she is able to walk a few blocks without experiencing any claudication symptoms.  She states she is not limited by her walking distance.  She denies rest pain or nonhealing wounds.  As far as her varicose veins are concerned.  These are not really bothersome to her.  She has not had any further recurrent episodes of thrombophlebitis.  Chronic medical problems remain hyperlipidemia and hypertension both of which are controlled.  She is on a statin.  Current Outpatient Medications on File Prior to Visit  Medication Sig Dispense Refill  . albuterol (PROVENTIL HFA;VENTOLIN HFA) 108 (90 Base) MCG/ACT inhaler Inhale 2 puffs into the lungs every 6 (six) hours as needed for wheezing or shortness of breath. 1 Inhaler 6  . benazepril (LOTENSIN) 20 MG tablet     . Calcium Carbonate-Vitamin D (CALTRATE 600+D PO) Take by mouth.    . cholecalciferol (VITAMIN D) 1000 UNITS tablet Take 2,000 Units by mouth daily.    . fluticasone furoate-vilanterol (BREO ELLIPTA) 100-25 MCG/INH AEPB Inhale 1 puff into the lungs daily.    . hydrochlorothiazide (HYDRODIURIL) 25 MG tablet TAKE ONE TABLET BY MOUTH EVERY DAY 30 tablet 0  . pravastatin (PRAVACHOL) 10 MG tablet Take 10 mg by mouth at bedtime.     No current facility-administered medications on file prior to visit.      Past Medical History:  Diagnosis Date  . Dental crowns present   . Dupuytren's contracture of left hand 09/2014   left middle finger  . Hyperlipidemia   . Hypertension    states under control with meds., has been on med. x 2 yr.   Review of systems: She denies shortness of breath.  She denies chest pain.  She denies any stroke symptoms.  Physical exam:  Vitals:   09/20/17 1153  BP: 135/80  Pulse: 84  Resp: 16  Temp: 97.9 F (36.6 C)  TempSrc: Oral  SpO2: 96%  Weight: 130 lb 12.8 oz (59.3 kg)  Height: 5'  6.5" (1.689 m)    Neck: No carotid bruits  Chest: Clear to auscultation bilaterally  Cardiac: Regular rate and rhythm  Abdomen: Soft nontender no pulsatile mass  Extremities: 2+ femoral pulses absent popliteal and pedal pulses, few scattered varicosities bilaterally no inflammation or erythema  Skin: No open ulcer or rash  Data: Patient had bilateral ABIs performed today.  Right side was 0.64 left was 0.91  Assessment: Stable peripheral arterial disease mildly symptomatic but not lifestyle limiting for the patient  Plan: Patient will continue to try to walk 30 minutes daily to improve overall collateralization and walking symptoms.  She will call us in the near future if she wishes to consider intervention but I did inform her today that she certainly is not at risk of limb loss and this will be for lifestyle issues only.  Otherwise she will follow-up in 1 year with repeat ABIs and see our nurse practitioner.  Ruta Hinds, MD [facility] Office: [phone] Pager: [phone]

## 2017-09-22 DIAGNOSIS — J849 Interstitial pulmonary disease, unspecified: Secondary | ICD-10-CM | POA: Diagnosis not present

## 2017-09-22 DIAGNOSIS — J449 Chronic obstructive pulmonary disease, unspecified: Secondary | ICD-10-CM | POA: Diagnosis not present

## 2017-09-28 DIAGNOSIS — M2560 Stiffness of unspecified joint, not elsewhere classified: Secondary | ICD-10-CM | POA: Diagnosis not present

## 2017-09-28 DIAGNOSIS — R293 Abnormal posture: Secondary | ICD-10-CM | POA: Diagnosis not present

## 2017-09-28 DIAGNOSIS — M542 Cervicalgia: Secondary | ICD-10-CM | POA: Diagnosis not present

## 2017-09-28 DIAGNOSIS — M5030 Other cervical disc degeneration, unspecified cervical region: Secondary | ICD-10-CM | POA: Diagnosis not present

## 2017-10-03 DIAGNOSIS — M5030 Other cervical disc degeneration, unspecified cervical region: Secondary | ICD-10-CM | POA: Diagnosis not present

## 2017-10-03 DIAGNOSIS — R293 Abnormal posture: Secondary | ICD-10-CM | POA: Diagnosis not present

## 2017-10-03 DIAGNOSIS — M2560 Stiffness of unspecified joint, not elsewhere classified: Secondary | ICD-10-CM | POA: Diagnosis not present

## 2017-10-03 DIAGNOSIS — M542 Cervicalgia: Secondary | ICD-10-CM | POA: Diagnosis not present

## 2017-10-05 DIAGNOSIS — H6121 Impacted cerumen, right ear: Secondary | ICD-10-CM | POA: Diagnosis not present

## 2017-10-05 DIAGNOSIS — H9191 Unspecified hearing loss, right ear: Secondary | ICD-10-CM | POA: Diagnosis not present

## 2017-10-22 ENCOUNTER — Encounter: Payer: Self-pay | Admitting: Pulmonary Disease

## 2017-10-22 ENCOUNTER — Ambulatory Visit: Payer: Medicare HMO | Admitting: Pulmonary Disease

## 2017-10-22 VITALS — BP 116/60 | HR 97 | Ht 66.0 in | Wt 128.8 lb

## 2017-10-22 DIAGNOSIS — G4734 Idiopathic sleep related nonobstructive alveolar hypoventilation: Secondary | ICD-10-CM | POA: Diagnosis not present

## 2017-10-22 DIAGNOSIS — J432 Centrilobular emphysema: Secondary | ICD-10-CM | POA: Diagnosis not present

## 2017-10-22 MED ORDER — GLYCOPYRROLATE-FORMOTEROL 9-4.8 MCG/ACT IN AERO: 2.0000 | INHALATION_SPRAY | Freq: Two times a day (BID) | RESPIRATORY_TRACT | 5 refills | Status: DC

## 2017-10-22 MED ORDER — GLYCOPYRROLATE-FORMOTEROL 9-4.8 MCG/ACT IN AERO: 2.0000 | INHALATION_SPRAY | Freq: Two times a day (BID) | RESPIRATORY_TRACT | 0 refills | Status: DC

## 2017-10-22 NOTE — Progress Notes (Signed)
[facility]  Chief Complaint  Patient presents with  . Follow-up    1  year follow up, using inhalers once daily. Uses 2L QHS.     Vital signs: BP 116/60   Pulse 97   Ht 5\' 6"  (1.676 m)   Wt 128 lb 12.8 oz (58.4 kg)   SpO2 95%   BMI 20.79 kg/m   History of Present Illness: Christie Peters is a 82 y.o. female former smoker with COPD/emphysema.  She has been using albuterol and atrovent several times per week.  She was tried on breo, but too expensive.  CT chest from January 2019 showed emphysema, stable aortic aneurysm.  She has intermittent cough and wheeze.  Not much sputum.  Denies chest pain, hemoptysis, fever.  She gets leg claudication when walking, and followed by Dr. Oneida Alar.   Physical Exam:  General - pleasant Eyes - pupils reactive ENT - no sinus tenderness, no oral exudate, no LAN Cardiac - regular, no murmur Chest - no wheeze, rales Abd - soft, non tender Ext - no edema Skin - no rashes Neuro - normal strength Psych - normal mood  Assessment/Plan:  COPD with emphysema. - will try adding bevespi - continue prn albuterol/atrovent  Sleep related hypoxia. - continue 2 liters supplemental oxygen at night  Thoracic aortic aneurysm. - most recent CT chest was stable  Claudication. - f/u with vascular surgery    Patient Instructions  Bevespi two puffs twice per day  Albuterol and Atrovent as needed for cough, wheeze, or shortness of breath  Follow up in 6 months  Time spent 26 minutes  Chesley Mires, MD [place] 10/22/2017, 5:08 PM  Flow Sheet  Pulmonary tests: PFT 01/14/16 >> FEV1 0.97 (42%), FEV1% 49, TLC 6.64 (122%), RV 5.08 (205%), DLCO 21%, +BD Serology 09/13/16 >> IgE 243, RF negative, anti CCP negative, SCL 70 negative, ANA positive, SSA 2.6, SSB negative, P ANCA positive 1:20  Chest imaging: CT chest 09/02/16 >> atherosclerosis, 4.7 cm ascending aorta, centrilobular  emphysema, patchy and peripheral opacities CT chest 12/04/16 >> 4.7 cm ascending aorta, apical scarring, centrilobular and paraseptal emphysema, much improved patchy ASD with mild residual BTX in RML/RLL CT chest 06/08/17 >> no change  Past Medical History: She  has a past medical history of Dental crowns present, Dupuytren's contracture of left hand (09/2014), Hyperlipidemia, and Hypertension.  Past Surgical History: She  has a past surgical history that includes Cesarean section; Colonoscopy with propofol (03/21/2013); Blepharoplasty (Bilateral, 02/17/2013); Cataract extraction w/ intraocular lens  implant, bilateral; and Fasciotomy (Left, 10/01/2014).  Family History: Her family history includes Birth defects in her sister; Breast cancer in her sister; Diabetes in her brother; Heart disease in her brother; Stroke in her mother.  Social History: She  reports that she quit smoking about 4 years ago. She has a 30.00 pack-year smoking history. She has never used smokeless tobacco. She reports that she drinks alcohol. She reports that she does not use drugs.  Medications: Allergies as of 10/22/2017      Reactions   Levofloxacin Itching, Rash      Medication List        Accurate as of 10/22/17  5:08 PM. Always use your most recent med list.          albuterol 108 (90 Base) MCG/ACT inhaler Commonly known as:  PROVENTIL HFA;VENTOLIN HFA Inhale 2 puffs into the lungs every 6 (six) hours as needed for wheezing or shortness of  breath.   benazepril 20 MG tablet Commonly known as:  LOTENSIN   CALTRATE 600+D PO Take by mouth.   cholecalciferol 1000 units tablet Commonly known as:  VITAMIN D Take 2,000 Units by mouth daily.   Glycopyrrolate-Formoterol 9-4.8 MCG/ACT Aero Commonly known as:  BEVESPI AEROSPHERE Inhale 2 puffs into the lungs 2 (two) times daily.   Glycopyrrolate-Formoterol 9-4.8 MCG/ACT Aero Commonly known as:  BEVESPI AEROSPHERE Inhale 2 puffs into the lungs 2 (two) times  daily.   hydrochlorothiazide 25 MG tablet Commonly known as:  HYDRODIURIL TAKE ONE TABLET BY MOUTH EVERY DAY   ipratropium 17 MCG/ACT inhaler Commonly known as:  ATROVENT HFA Inhale 2 puffs into the lungs every 6 (six) hours as needed for wheezing.   pravastatin 10 MG tablet Commonly known as:  PRAVACHOL Take 10 mg by mouth at bedtime.

## 2017-10-22 NOTE — Patient Instructions (Addendum)
Bevespi two puffs twice per day  Albuterol and Atrovent as needed for cough, wheeze, or shortness of breath  Follow up in 6 months

## 2017-10-23 DIAGNOSIS — J849 Interstitial pulmonary disease, unspecified: Secondary | ICD-10-CM | POA: Diagnosis not present

## 2017-10-23 DIAGNOSIS — Z780 Asymptomatic menopausal state: Secondary | ICD-10-CM | POA: Diagnosis not present

## 2017-10-23 DIAGNOSIS — M8589 Other specified disorders of bone density and structure, multiple sites: Secondary | ICD-10-CM | POA: Diagnosis not present

## 2017-10-23 DIAGNOSIS — J449 Chronic obstructive pulmonary disease, unspecified: Secondary | ICD-10-CM | POA: Diagnosis not present

## 2017-10-31 DIAGNOSIS — M810 Age-related osteoporosis without current pathological fracture: Secondary | ICD-10-CM | POA: Diagnosis not present

## 2017-10-31 DIAGNOSIS — M2560 Stiffness of unspecified joint, not elsewhere classified: Secondary | ICD-10-CM | POA: Diagnosis not present

## 2017-11-22 DIAGNOSIS — J849 Interstitial pulmonary disease, unspecified: Secondary | ICD-10-CM | POA: Diagnosis not present

## 2017-11-22 DIAGNOSIS — J449 Chronic obstructive pulmonary disease, unspecified: Secondary | ICD-10-CM | POA: Diagnosis not present

## 2017-12-03 ENCOUNTER — Telehealth: Payer: Self-pay | Admitting: Pulmonary Disease

## 2017-12-03 DIAGNOSIS — J449 Chronic obstructive pulmonary disease, unspecified: Secondary | ICD-10-CM

## 2017-12-03 NOTE — Telephone Encounter (Signed)
Attempted to call pt. I did not receive an answer. I have left a message for pt to return our call.  

## 2017-12-04 NOTE — Telephone Encounter (Signed)
Order has been placed for POC for travel. Pt is aware. Nothing further was needed.

## 2017-12-04 NOTE — Telephone Encounter (Signed)
Okay to send order.  Needs 2 liters oxygen.

## 2017-12-04 NOTE — Telephone Encounter (Signed)
Pt is calling back [phone]

## 2017-12-04 NOTE — Telephone Encounter (Signed)
Spoke with pt. She is going to be traveling out [place] to see her daughter. Pt is currently on night time oxygen and will need a prescription for a POC to travel with.  VS - please advise if this is okay to order. Thanks.

## 2017-12-04 NOTE — Telephone Encounter (Signed)
Attempted to call pt. I did not receive an answer. I have left a message for pt to return our call.  

## 2017-12-12 ENCOUNTER — Encounter: Payer: Self-pay | Admitting: Pulmonary Disease

## 2017-12-12 ENCOUNTER — Ambulatory Visit: Payer: Medicare HMO | Admitting: Pulmonary Disease

## 2017-12-12 VITALS — BP 124/74 | HR 82 | Ht 66.0 in | Wt 130.0 lb

## 2017-12-12 DIAGNOSIS — J449 Chronic obstructive pulmonary disease, unspecified: Secondary | ICD-10-CM | POA: Diagnosis not present

## 2017-12-12 DIAGNOSIS — G4734 Idiopathic sleep related nonobstructive alveolar hypoventilation: Secondary | ICD-10-CM

## 2017-12-12 DIAGNOSIS — J432 Centrilobular emphysema: Secondary | ICD-10-CM

## 2017-12-12 MED ORDER — BUDESONIDE-FORMOTEROL FUMARATE 160-4.5 MCG/ACT IN AERO: 2.0000 | INHALATION_SPRAY | Freq: Two times a day (BID) | RESPIRATORY_TRACT | 11 refills | Status: DC

## 2017-12-12 MED ORDER — BUDESONIDE-FORMOTEROL FUMARATE 160-4.5 MCG/ACT IN AERO: 2.0000 | INHALATION_SPRAY | Freq: Two times a day (BID) | RESPIRATORY_TRACT | 6 refills | Status: DC

## 2017-12-12 NOTE — Progress Notes (Signed)
[facility]  Chief Complaint  Patient presents with  . Acute Visit    pt reports of increased sob with exertion & wheezing.    Constitutional: BP 124/74 (BP Location: Left Arm, Cuff Size: Normal)   Pulse 82   Ht 5\' 6"  (1.676 m)   Wt 130 lb (59 kg)   SpO2 97%   BMI 20.98 kg/m   History of Present Illness: Christie Peters is a 82 y.o. female former smoker with COPD/emphysema.  She wasn't able to afford bevespi.    Has intermittent cough, wheeze, and chest congestion.  Gets winded very easily with minimal activity.  Not having fever, sweats, chest pain, rash, or swelling.  Using albuterol several times per day.  Uses oxygen at night.  Wants to know if she needs oxygen during the day also.   Comprehensive Respiratory Exam:  Appearance - well kempt, thin ENMT - nasal mucosa moist, turbinates clear, midline nasal septum, no dental lesions, no gingival bleeding, no oral exudates, no tonsillar hypertrophy Neck - no masses, trachea midline, no thyromegaly, no elevation in JVP Respiratory - normal appearance of chest wall, normal respiratory effort w/o accessory muscle use, no dullness on percussion, faint wheeze on Lt that cleared with coughing, no rales CV - s1s2 regular rate and rhythm, no murmurs, no peripheral edema, radial pulses symmetric GI - soft, non tender, no masses Lymph - no adenopathy noted in neck and axillary areas MSK - normal muscle strength and tone, normal gait Ext - no cyanosis, clubbing, or joint inflammation noted Skin - no rashes, lesions, or ulcers Neuro - oriented to person, place, and time Psych - normal mood and affect  Assessment/Plan:  COPD with emphysema. - will try her on symbicort and have her check with insurance if there is more affordable alternative - prn albuterol/atrovent  Sleep related hypoxia. - she did not have oxygen desaturation with ambulation in office on room air today - continue 2 liters  at night   Patient Instructions  Symbicort two puffs twice per day and rinse mouth after each use  Check with your insurance about which inhalers they approve for COPD  Follow up in 6 months    Chesley Mires, MD [place] 12/12/2017, 2:51 PM  Flow Sheet  Pulmonary tests: PFT 01/14/16 >> FEV1 0.97 (42%), FEV1% 49, TLC 6.64 (122%), RV 5.08 (205%), DLCO 21%, +BD Serology 09/13/16 >> IgE 243, RF negative, anti CCP negative, SCL 70 negative, ANA positive, SSA 2.6, SSB negative, P ANCA positive 1:20  Chest imaging: CT chest 09/02/16 >> atherosclerosis, 4.7 cm ascending aorta, centrilobular emphysema, patchy and peripheral opacities CT chest 12/04/16 >> 4.7 cm ascending aorta, apical scarring, centrilobular and paraseptal emphysema, much improved patchy ASD with mild residual BTX in RML/RLL CT chest 06/08/17 >> no change  Past Medical History: She  has a past medical history of Dental crowns present, Dupuytren's contracture of left hand (09/2014), Hyperlipidemia, and Hypertension.  Past Surgical History: She  has a past surgical history that includes Cesarean section; Colonoscopy with propofol (03/21/2013); Blepharoplasty (Bilateral, 02/17/2013); Cataract extraction w/ intraocular lens  implant, bilateral; and Fasciotomy (Left, 10/01/2014).  Family History: Her family history includes Birth defects in her sister; Breast cancer in her sister; Diabetes in her brother; Heart disease in her brother; Stroke in her mother.  Social History: She  reports that she quit smoking about 4 years ago. She has a 30.00 pack-year smoking history. She has never used smokeless tobacco.  She reports that she drinks alcohol. She reports that she does not use drugs.  Medications: Allergies as of 12/12/2017      Reactions   Levofloxacin Itching, Rash      Medication List        Accurate as of 12/12/17  2:51 PM. Always use your most recent med list.          albuterol 108 (90 Base)  MCG/ACT inhaler Commonly known as:  PROVENTIL HFA;VENTOLIN HFA Inhale 2 puffs into the lungs every 6 (six) hours as needed for wheezing or shortness of breath.   benazepril 20 MG tablet Commonly known as:  LOTENSIN   budesonide-formoterol 160-4.5 MCG/ACT inhaler Commonly known as:  SYMBICORT Inhale 2 puffs into the lungs 2 (two) times daily.   budesonide-formoterol 160-4.5 MCG/ACT inhaler Commonly known as:  SYMBICORT Inhale 2 puffs into the lungs every 12 (twelve) hours.   CALTRATE 600+D PO Take by mouth.   cholecalciferol 1000 units tablet Commonly known as:  VITAMIN D Take 2,000 Units by mouth daily.   hydrochlorothiazide 25 MG tablet Commonly known as:  HYDRODIURIL TAKE ONE TABLET BY MOUTH EVERY DAY   ipratropium 17 MCG/ACT inhaler Commonly known as:  ATROVENT HFA Inhale 2 puffs into the lungs every 6 (six) hours as needed for wheezing.   pravastatin 10 MG tablet Commonly known as:  PRAVACHOL Take 10 mg by mouth at bedtime.

## 2017-12-12 NOTE — Patient Instructions (Signed)
Symbicort two puffs twice per day and rinse mouth after each use  Check with your insurance about which inhalers they approve for COPD  Follow up in 6 months

## 2017-12-14 ENCOUNTER — Telehealth: Payer: Self-pay | Admitting: Pulmonary Disease

## 2017-12-14 NOTE — Telephone Encounter (Signed)
Checked fax up front, did not see fax. Left message for Halina to call back to see what type of form was faxed over and if she had the correct number.

## 2017-12-14 NOTE — Telephone Encounter (Signed)
rec'd fax from [facility] for O2 con't rx Placed form in VS green to do folder for review.

## 2017-12-17 NOTE — Telephone Encounter (Signed)
At this time no fax is rec'd at this time up front office LVM for Halina at phone [phone] with [facility] to see if she can refax today. Will f/u later in the week

## 2017-12-18 NOTE — Telephone Encounter (Signed)
Spoke to patient regarding fax from [employer]. LVM for Halina at phone [phone] with [facility] to see if she can refax today. Pt is calling [facility] for refax for O2 form for VS to sign today. Will f/u later this week.

## 2017-12-18 NOTE — Telephone Encounter (Signed)
Spoke to patient regarding receiving fax for O2 concentrator  Advised patient that we have not rec'd fax at this time Called several times to DME to get the fax resent, no response Pt is calling DME now, and will have them fax it to triage fax [phone] Will f/u with pt later this week.

## 2017-12-18 NOTE — Telephone Encounter (Signed)
Pt is returning call. Cb is [phone].

## 2017-12-18 NOTE — Telephone Encounter (Signed)
At this time no fax is rec'd at this time up front office; no fax rec'd at this time LVM for s/o at phone [phone] with [facility] to see if she can refax today. Will f/u later in the week

## 2017-12-18 NOTE — Telephone Encounter (Signed)
Patient is returning call. CB is [phone]

## 2017-12-19 NOTE — Telephone Encounter (Signed)
Pt is returning call. Pt is sched to go out of town next week and is needing to get this resolved by then. Cb is [phone].

## 2017-12-19 NOTE — Telephone Encounter (Signed)
Faxed forms to [facility] today VS signed and completed all forms Nothing further needed.

## 2017-12-19 NOTE — Telephone Encounter (Signed)
LVM for Christie Peters at phone [phone] with [facility] to see if she can refax today. At this time no fax has been rec'd.

## 2017-12-19 NOTE — Telephone Encounter (Signed)
I have checked Dr. Juanetta Gosling look at. This fax still has not been received. I have contacted [facility] to have this refaxed again.  Attempted to contact pt. I did not receive an answer. There was no option for me to leave a message. Will try back.

## 2017-12-19 NOTE — Telephone Encounter (Signed)
Called and spoke with pt regarding we rec'd the fax, paperwork is completed, signed and faxed.  Faxed forms to [facility] today VS signed and completed all forms Nothing further needed.

## 2017-12-23 DIAGNOSIS — J849 Interstitial pulmonary disease, unspecified: Secondary | ICD-10-CM | POA: Diagnosis not present

## 2017-12-23 DIAGNOSIS — J449 Chronic obstructive pulmonary disease, unspecified: Secondary | ICD-10-CM | POA: Diagnosis not present

## 2018-01-23 DIAGNOSIS — J449 Chronic obstructive pulmonary disease, unspecified: Secondary | ICD-10-CM | POA: Diagnosis not present

## 2018-01-23 DIAGNOSIS — J849 Interstitial pulmonary disease, unspecified: Secondary | ICD-10-CM | POA: Diagnosis not present

## 2018-01-25 ENCOUNTER — Telehealth: Payer: Self-pay | Admitting: Pulmonary Disease

## 2018-01-25 DIAGNOSIS — J849 Interstitial pulmonary disease, unspecified: Secondary | ICD-10-CM

## 2018-01-25 DIAGNOSIS — J449 Chronic obstructive pulmonary disease, unspecified: Secondary | ICD-10-CM

## 2018-01-25 NOTE — Telephone Encounter (Signed)
Spoke with patient, patient wanted to make VS aware she has brought her own oxygen concentrator and would like an order sent to [facility] to D/C oxygen so she no longer has to pay rental fee. Informed patient VS would not be back in the office until the 16th. Patient okay with waiting.   VS please advise if okay to place order.

## 2018-01-27 NOTE — Telephone Encounter (Signed)
Okay to send order. 

## 2018-01-28 NOTE — Telephone Encounter (Signed)
Order placed.  Pt aware.  Nothing further needed.  

## 2018-02-13 DIAGNOSIS — R69 Illness, unspecified: Secondary | ICD-10-CM | POA: Diagnosis not present

## 2018-02-18 DIAGNOSIS — J449 Chronic obstructive pulmonary disease, unspecified: Secondary | ICD-10-CM | POA: Diagnosis not present

## 2018-02-18 DIAGNOSIS — I10 Essential (primary) hypertension: Secondary | ICD-10-CM | POA: Diagnosis not present

## 2018-02-18 DIAGNOSIS — Z6820 Body mass index (BMI) 20.0-20.9, adult: Secondary | ICD-10-CM | POA: Diagnosis not present

## 2018-02-18 DIAGNOSIS — J209 Acute bronchitis, unspecified: Secondary | ICD-10-CM | POA: Diagnosis not present

## 2018-02-18 DIAGNOSIS — R062 Wheezing: Secondary | ICD-10-CM | POA: Diagnosis not present

## 2018-02-18 DIAGNOSIS — R05 Cough: Secondary | ICD-10-CM | POA: Diagnosis not present

## 2018-02-18 DIAGNOSIS — Z87891 Personal history of nicotine dependence: Secondary | ICD-10-CM | POA: Diagnosis not present

## 2018-03-27 DIAGNOSIS — M859 Disorder of bone density and structure, unspecified: Secondary | ICD-10-CM | POA: Diagnosis not present

## 2018-03-27 DIAGNOSIS — E7849 Other hyperlipidemia: Secondary | ICD-10-CM | POA: Diagnosis not present

## 2018-03-27 DIAGNOSIS — I10 Essential (primary) hypertension: Secondary | ICD-10-CM | POA: Diagnosis not present

## 2018-03-27 DIAGNOSIS — R82998 Other abnormal findings in urine: Secondary | ICD-10-CM | POA: Diagnosis not present

## 2018-04-01 ENCOUNTER — Other Ambulatory Visit: Payer: Self-pay | Admitting: Internal Medicine

## 2018-04-01 DIAGNOSIS — Z1231 Encounter for screening mammogram for malignant neoplasm of breast: Secondary | ICD-10-CM

## 2018-04-02 DIAGNOSIS — Z0000 Encounter for general adult medical examination without abnormal findings: Secondary | ICD-10-CM | POA: Diagnosis not present

## 2018-04-02 DIAGNOSIS — E7849 Other hyperlipidemia: Secondary | ICD-10-CM | POA: Diagnosis not present

## 2018-04-02 DIAGNOSIS — I10 Essential (primary) hypertension: Secondary | ICD-10-CM | POA: Diagnosis not present

## 2018-04-02 DIAGNOSIS — M859 Disorder of bone density and structure, unspecified: Secondary | ICD-10-CM | POA: Diagnosis not present

## 2018-04-02 DIAGNOSIS — Z23 Encounter for immunization: Secondary | ICD-10-CM | POA: Diagnosis not present

## 2018-04-02 DIAGNOSIS — J449 Chronic obstructive pulmonary disease, unspecified: Secondary | ICD-10-CM | POA: Diagnosis not present

## 2018-04-02 DIAGNOSIS — Z6820 Body mass index (BMI) 20.0-20.9, adult: Secondary | ICD-10-CM | POA: Diagnosis not present

## 2018-04-02 DIAGNOSIS — I7389 Other specified peripheral vascular diseases: Secondary | ICD-10-CM | POA: Diagnosis not present

## 2018-04-02 DIAGNOSIS — Z1389 Encounter for screening for other disorder: Secondary | ICD-10-CM | POA: Diagnosis not present

## 2018-05-08 DIAGNOSIS — M810 Age-related osteoporosis without current pathological fracture: Secondary | ICD-10-CM | POA: Diagnosis not present

## 2018-05-08 DIAGNOSIS — M2560 Stiffness of unspecified joint, not elsewhere classified: Secondary | ICD-10-CM | POA: Diagnosis not present

## 2018-05-17 ENCOUNTER — Ambulatory Visit: Payer: Medicare HMO

## 2018-05-23 ENCOUNTER — Ambulatory Visit
Admission: RE | Admit: 2018-05-23 | Discharge: 2018-05-23 | Disposition: A | Discharge status: Home / Self Care | Payer: Medicare HMO | Source: Ambulatory Visit | Attending: Internal Medicine | Admitting: Internal Medicine

## 2018-05-23 DIAGNOSIS — Z1231 Encounter for screening mammogram for malignant neoplasm of breast: Secondary | ICD-10-CM

## 2018-05-24 ENCOUNTER — Other Ambulatory Visit: Payer: Self-pay | Admitting: Internal Medicine

## 2018-05-24 DIAGNOSIS — R928 Other abnormal and inconclusive findings on diagnostic imaging of breast: Secondary | ICD-10-CM

## 2018-05-29 ENCOUNTER — Ambulatory Visit
Admission: RE | Admit: 2018-05-29 | Discharge: 2018-05-29 | Disposition: A | Discharge status: Home / Self Care | Payer: Medicare HMO | Source: Ambulatory Visit | Attending: Internal Medicine | Admitting: Internal Medicine

## 2018-05-29 ENCOUNTER — Ambulatory Visit: Payer: Medicare HMO

## 2018-05-29 DIAGNOSIS — R928 Other abnormal and inconclusive findings on diagnostic imaging of breast: Secondary | ICD-10-CM

## 2018-05-29 DIAGNOSIS — R922 Inconclusive mammogram: Secondary | ICD-10-CM | POA: Diagnosis not present

## 2018-06-12 ENCOUNTER — Encounter: Payer: Self-pay | Admitting: Pulmonary Disease

## 2018-06-12 ENCOUNTER — Ambulatory Visit: Payer: Medicare HMO | Admitting: Pulmonary Disease

## 2018-06-12 VITALS — BP 122/68 | HR 86 | Ht 66.0 in | Wt 135.6 lb

## 2018-06-12 DIAGNOSIS — G4734 Idiopathic sleep related nonobstructive alveolar hypoventilation: Secondary | ICD-10-CM

## 2018-06-12 DIAGNOSIS — J432 Centrilobular emphysema: Secondary | ICD-10-CM

## 2018-06-12 MED ORDER — ALBUTEROL SULFATE HFA 108 (90 BASE) MCG/ACT IN AERS: 2.0000 | INHALATION_SPRAY | Freq: Four times a day (QID) | RESPIRATORY_TRACT | 5 refills | Status: DC | PRN

## 2018-06-12 NOTE — Patient Instructions (Signed)
Follow up in 1 year.

## 2018-06-12 NOTE — Progress Notes (Signed)
[facility] Care, [facility] Sleep Medicine  Chief Complaint  Patient presents with  . Follow-up    stopped symbicort - thinks shes on trelegy now - some cough/SOB with activity - uses home 02 at night    Constitutional:  BP 122/68 (BP Location: Right Arm, Patient Position: Sitting, Cuff Size: Normal)   Pulse 86   Ht 5\' 6"  (1.676 m)   Wt 135 lb 9.6 oz (61.5 kg)   SpO2 95%   BMI 21.89 kg/m   Past Medical History:  HTN, HLD  Brief Summary:  Christie Peters is a 83 y.o. female former smoker with COPD/emphysema.  She has been doing well.  Had symbicort changed to trelegy by PCP.  Feels this works better.  Doesn't have albuterol.  Gets occasional cough and chest congestion.  Keeps up with routine activities.  Not having trouble with her sleep.  Still uses oxygen at night.  She had trouble with [facility] and ended up buying her own oxygen equipment.  She needs a letter for insurance coverage.  She no longer has equipment from [facility].   Physical Exam:   Appearance - well kempt   ENMT - clear nasal mucosa, midline nasal  septum, no oral exudates, no LAN, trachea midline  Respiratory - normal chest wall, normal respiratory effort, no accessory muscle use, no wheeze/rales  CV - s1s2 regular rate and rhythm, no murmurs, no peripheral edema, radial pulses symmetric  GI - soft, non tender, no masses  Lymph - no adenopathy noted in neck and axillary areas  MSK - normal gait  Ext - no cyanosis, clubbing, or joint inflammation noted  Skin - no rashes, lesions, or ulcers  Neuro - normal strength, oriented x 3  Psych - normal mood and affect   Assessment/Plan:   COPD with emphysema. - continue trelegy - prn ventolin  Sleep related hypoxia. - continue 2 liters oxygen at night - she will let us know which company she purchased her home oxygen set up from - will call her when letter is available to see if she can get insurance coverage for home oxygen equipment she  purchased   Patient Instructions  Follow up in 1 year   Chesley Mires, MD [place] Pager: [phone] 06/12/2018, 12:43 PM  Flow Sheet     Peters tests:  PFT 01/14/16 >> FEV1 0.97 (42%), FEV1% 49, TLC 6.64 (122%), RV 5.08 (205%), DLCO 21%, +BD Serology 09/13/16 >> IgE 243, RF negative, anti CCP negative, SCL 70 negative, ANA positive, SSA 2.6, SSB negative, P ANCA positive 1:20  Chest imaging:  CT chest 09/02/16 >> atherosclerosis, 4.7 cm ascending aorta, centrilobular emphysema, patchy and peripheral opacities CT chest 12/04/16 >> 4.7 cm ascending aorta, apical scarring, centrilobular and paraseptal emphysema, much improved patchy ASD with mild residual BTX in RML/RLL CT chest 06/08/17 >> no change  Medications:   Allergies as of 06/12/2018      Reactions   Levofloxacin Itching, Rash      Medication List       Accurate as of June 12, 2018 12:43 PM. Always use your most recent med list.        albuterol 108 (90 Base) MCG/ACT inhaler Commonly known as:  VENTOLIN HFA Inhale 2 puffs into the lungs every 6 (six) hours as needed for wheezing or shortness of breath.   benazepril 20 MG tablet Commonly known as:  LOTENSIN   CALTRATE 600+D PO Take by mouth.   hydrochlorothiazide 25 MG tablet Commonly  known as:  HYDRODIURIL TAKE ONE TABLET BY MOUTH EVERY DAY   pravastatin 10 MG tablet Commonly known as:  PRAVACHOL Take 10 mg by mouth at bedtime.   TRELEGY ELLIPTA 100-62.5-25 MCG/INH Aepb Generic drug:  Fluticasone-Umeclidin-Vilant Inhale 2 puffs into the lungs 2 (two) times daily.       Past Surgical History:  She  has a past surgical history that includes Cesarean section; Colonoscopy with propofol (03/21/2013); Blepharoplasty (Bilateral, 02/17/2013); Cataract extraction w/ intraocular lens  implant, bilateral; and Fasciotomy (Left, 10/01/2014).  Family History:  Her family history includes Birth defects in her sister; Breast cancer in her  sister; Diabetes in her brother; Heart disease in her brother; Stroke in her mother.  Social History:  She  reports that she quit smoking about 4 years ago. She has a 30.00 pack-year smoking history. She has never used smokeless tobacco. She reports current alcohol use. She reports that she does not use drugs.

## 2018-06-20 ENCOUNTER — Encounter: Payer: Self-pay | Admitting: Internal Medicine

## 2018-07-05 DIAGNOSIS — J449 Chronic obstructive pulmonary disease, unspecified: Secondary | ICD-10-CM | POA: Diagnosis not present

## 2018-07-05 DIAGNOSIS — G4709 Other insomnia: Secondary | ICD-10-CM | POA: Diagnosis not present

## 2018-07-05 DIAGNOSIS — I10 Essential (primary) hypertension: Secondary | ICD-10-CM | POA: Diagnosis not present

## 2018-07-05 DIAGNOSIS — G4700 Insomnia, unspecified: Secondary | ICD-10-CM | POA: Insufficient documentation

## 2018-07-05 DIAGNOSIS — Z6821 Body mass index (BMI) 21.0-21.9, adult: Secondary | ICD-10-CM | POA: Diagnosis not present

## 2018-07-05 DIAGNOSIS — E7849 Other hyperlipidemia: Secondary | ICD-10-CM | POA: Diagnosis not present

## 2018-07-08 DIAGNOSIS — Z961 Presence of intraocular lens: Secondary | ICD-10-CM | POA: Diagnosis not present

## 2018-07-08 DIAGNOSIS — H524 Presbyopia: Secondary | ICD-10-CM | POA: Diagnosis not present

## 2018-07-08 DIAGNOSIS — H5203 Hypermetropia, bilateral: Secondary | ICD-10-CM | POA: Diagnosis not present

## 2018-07-08 DIAGNOSIS — H1789 Other corneal scars and opacities: Secondary | ICD-10-CM | POA: Diagnosis not present

## 2018-07-30 DIAGNOSIS — D225 Melanocytic nevi of trunk: Secondary | ICD-10-CM | POA: Diagnosis not present

## 2018-07-30 DIAGNOSIS — L298 Other pruritus: Secondary | ICD-10-CM | POA: Diagnosis not present

## 2018-07-30 DIAGNOSIS — Z85828 Personal history of other malignant neoplasm of skin: Secondary | ICD-10-CM | POA: Diagnosis not present

## 2018-07-30 DIAGNOSIS — L821 Other seborrheic keratosis: Secondary | ICD-10-CM | POA: Diagnosis not present

## 2018-07-30 DIAGNOSIS — L905 Scar conditions and fibrosis of skin: Secondary | ICD-10-CM | POA: Diagnosis not present

## 2018-08-12 ENCOUNTER — Telehealth: Payer: Self-pay | Admitting: Pulmonary Disease

## 2018-08-12 NOTE — Telephone Encounter (Signed)
Called the patient back and she her husband were supposed to be going on a cruise. However, due to coronavirus the cruise was cancelled and the company offered to credit or forward part of what they paid to another cruise. Which the patient and her spouse declined.  The cruise line sent a form that will need to be completed and signed by Dr.Sood saying due to her pre-existing condition it is not recommended that she travel at this time.  The patient will be faxing the form for Dr. Halford Chessman to review and sign to [phone]. The patient has requested for the form to be mailed back to her when completed.  Patient voiced understanding.  Message routed to [facility] as [place].

## 2018-08-13 NOTE — Telephone Encounter (Signed)
Patient would like to know if we got the letter faxed to [place] today.  Patient would like to come pick up the letter.  Patient phone number is [phone].

## 2018-08-13 NOTE — Telephone Encounter (Signed)
Spoke with patient. She wanted to know if we had received the letter stating exactly what she needed. Advised her that we did receive the letter and I would ask Dr. Halford Chessman if he is ok with providing such a letter. She verbalized understanding.   Letter has been placed in VS' look-at folder. Will route to [facility] for followup.

## 2018-08-19 ENCOUNTER — Encounter: Payer: Self-pay | Admitting: General Surgery

## 2018-08-19 NOTE — Telephone Encounter (Signed)
Call made to patient aware, voiced understanding. She asked if we could have another MD to sign in his absence. Made patient aware due to the circumstances I would ask but that did not mean another MD would be willing to do so. Made aware I would message VS to get his recommendations. Voiced understanding.   VS please advise if you have seen this letter from the [place]. Thanks.

## 2018-08-19 NOTE — Telephone Encounter (Signed)
I am okay signing a letter regarding the patient's known COPD and that she should not be traveling this time.  Please generate and print and I will sign.  Wyn Quaker FNP

## 2018-08-19 NOTE — Telephone Encounter (Signed)
Message routed to app of the day to review the information below:  Aaron Edelman: per conversation, please see below. Thank.

## 2018-08-19 NOTE — Telephone Encounter (Signed)
Please ask one of the NPs to review.

## 2018-08-19 NOTE — Telephone Encounter (Signed)
Dr. Halford Chessman will be back in office April 6th, will address at that time.

## 2018-08-19 NOTE — Telephone Encounter (Signed)
Called the patient to make her aware the letter has been approved to be signed. The patient agreed letter can be faxed to her directly to [phone]. Original will be mailed.    Tried to fax the letter to the patient twice. It would not go through. Called the patient to make her aware. The hardcopy will be mailed.

## 2018-09-10 DIAGNOSIS — H1789 Other corneal scars and opacities: Secondary | ICD-10-CM | POA: Diagnosis not present

## 2018-09-10 DIAGNOSIS — H00015 Hordeolum externum left lower eyelid: Secondary | ICD-10-CM | POA: Diagnosis not present

## 2018-09-10 DIAGNOSIS — H5712 Ocular pain, left eye: Secondary | ICD-10-CM | POA: Diagnosis not present

## 2018-09-25 DIAGNOSIS — H0015 Chalazion left lower eyelid: Secondary | ICD-10-CM | POA: Diagnosis not present

## 2018-09-25 DIAGNOSIS — H0100A Unspecified blepharitis right eye, upper and lower eyelids: Secondary | ICD-10-CM | POA: Diagnosis not present

## 2018-09-25 DIAGNOSIS — H0100B Unspecified blepharitis left eye, upper and lower eyelids: Secondary | ICD-10-CM | POA: Diagnosis not present

## 2019-02-05 DIAGNOSIS — R69 Illness, unspecified: Secondary | ICD-10-CM | POA: Diagnosis not present

## 2019-02-10 DIAGNOSIS — R55 Syncope and collapse: Secondary | ICD-10-CM | POA: Diagnosis not present

## 2019-02-10 DIAGNOSIS — R42 Dizziness and giddiness: Secondary | ICD-10-CM | POA: Diagnosis not present

## 2019-02-10 DIAGNOSIS — I10 Essential (primary) hypertension: Secondary | ICD-10-CM | POA: Diagnosis not present

## 2019-02-11 ENCOUNTER — Emergency Department (HOSPITAL_COMMUNITY): Payer: Medicare HMO

## 2019-02-11 ENCOUNTER — Encounter (HOSPITAL_COMMUNITY): Payer: Self-pay | Admitting: Emergency Medicine

## 2019-02-11 ENCOUNTER — Emergency Department (HOSPITAL_COMMUNITY)
Admission: EM | Admit: 2019-02-11 | Discharge: 2019-02-11 | Disposition: A | Discharge status: Home / Self Care | Payer: Medicare HMO | Attending: Emergency Medicine | Admitting: Emergency Medicine

## 2019-02-11 DIAGNOSIS — Z79899 Other long term (current) drug therapy: Secondary | ICD-10-CM | POA: Diagnosis not present

## 2019-02-11 DIAGNOSIS — Y929 Unspecified place or not applicable: Secondary | ICD-10-CM | POA: Diagnosis not present

## 2019-02-11 DIAGNOSIS — Z23 Encounter for immunization: Secondary | ICD-10-CM | POA: Insufficient documentation

## 2019-02-11 DIAGNOSIS — S0181XA Laceration without foreign body of other part of head, initial encounter: Secondary | ICD-10-CM | POA: Diagnosis not present

## 2019-02-11 DIAGNOSIS — W101XXA Fall (on)(from) sidewalk curb, initial encounter: Secondary | ICD-10-CM | POA: Diagnosis not present

## 2019-02-11 DIAGNOSIS — Z87891 Personal history of nicotine dependence: Secondary | ICD-10-CM | POA: Insufficient documentation

## 2019-02-11 DIAGNOSIS — S60512A Abrasion of left hand, initial encounter: Secondary | ICD-10-CM | POA: Insufficient documentation

## 2019-02-11 DIAGNOSIS — I10 Essential (primary) hypertension: Secondary | ICD-10-CM | POA: Insufficient documentation

## 2019-02-11 DIAGNOSIS — W19XXXA Unspecified fall, initial encounter: Secondary | ICD-10-CM

## 2019-02-11 DIAGNOSIS — Y999 Unspecified external cause status: Secondary | ICD-10-CM | POA: Diagnosis not present

## 2019-02-11 DIAGNOSIS — Y9301 Activity, walking, marching and hiking: Secondary | ICD-10-CM | POA: Insufficient documentation

## 2019-02-11 DIAGNOSIS — S01112A Laceration without foreign body of left eyelid and periocular area, initial encounter: Secondary | ICD-10-CM | POA: Diagnosis present

## 2019-02-11 DIAGNOSIS — S0990XA Unspecified injury of head, initial encounter: Secondary | ICD-10-CM | POA: Insufficient documentation

## 2019-02-11 DIAGNOSIS — S199XXA Unspecified injury of neck, initial encounter: Secondary | ICD-10-CM | POA: Diagnosis not present

## 2019-02-11 LAB — CBC WITH DIFFERENTIAL/PLATELET
Abs Immature Granulocytes: 0.03 10*3/uL (ref 0.00–0.07)
Basophils Absolute: 0.1 10*3/uL (ref 0.0–0.1)
Basophils Relative: 1 %
Eosinophils Absolute: 0.1 10*3/uL (ref 0.0–0.5)
Eosinophils Relative: 1 %
HCT: 37.6 % (ref 36.0–46.0)
Hemoglobin: 12.9 g/dL (ref 12.0–15.0)
Immature Granulocytes: 1 %
Lymphocytes Relative: 19 %
Lymphs Abs: 1.2 10*3/uL (ref 0.7–4.0)
MCH: 34.5 pg — ABNORMAL HIGH (ref 26.0–34.0)
MCHC: 34.3 g/dL (ref 30.0–36.0)
MCV: 100.5 fL — ABNORMAL HIGH (ref 80.0–100.0)
Monocytes Absolute: 0.6 10*3/uL (ref 0.1–1.0)
Monocytes Relative: 10 %
Neutro Abs: 4.3 10*3/uL (ref 1.7–7.7)
Neutrophils Relative %: 68 %
Platelets: 236 10*3/uL (ref 150–400)
RBC: 3.74 MIL/uL — ABNORMAL LOW (ref 3.87–5.11)
RDW: 11.9 % (ref 11.5–15.5)
WBC: 6.3 10*3/uL (ref 4.0–10.5)
nRBC: 0 % (ref 0.0–0.2)

## 2019-02-11 LAB — COMPREHENSIVE METABOLIC PANEL
ALT: 11 U/L (ref 0–44)
AST: 27 U/L (ref 15–41)
Albumin: 4.3 g/dL (ref 3.5–5.0)
Alkaline Phosphatase: 52 U/L (ref 38–126)
Anion gap: 14 (ref 5–15)
BUN: 14 mg/dL (ref 8–23)
CO2: 29 mmol/L (ref 22–32)
Calcium: 10.2 mg/dL (ref 8.9–10.3)
Chloride: 90 mmol/L — ABNORMAL LOW (ref 98–111)
Creatinine, Ser: 0.71 mg/dL (ref 0.44–1.00)
GFR calc Af Amer: >60 mL/min (ref >60)
GFR calc non Af Amer: >60 mL/min (ref >60)
Glucose, Bld: 97 mg/dL (ref 70–99)
Potassium: 4.5 mmol/L (ref 3.5–5.1)
Sodium: 133 mmol/L — ABNORMAL LOW (ref 135–145)
Total Bilirubin: 0.9 mg/dL (ref 0.3–1.2)
Total Protein: 7.1 g/dL (ref 6.5–8.1)

## 2019-02-11 MED ORDER — TETANUS-DIPHTH-ACELL PERTUSSIS 5-2.5-18.5 LF-MCG/0.5 IM SUSP
0.5000 mL | Freq: Once | INTRAMUSCULAR | Status: AC
  Administered 2019-02-11: 0.5 mL via INTRAMUSCULAR
  Filled 2019-02-11: qty 0.5

## 2019-02-11 MED ORDER — ACETAMINOPHEN 325 MG PO TABS
650.0000 mg | ORAL_TABLET | Freq: Once | ORAL | Status: AC
  Administered 2019-02-11: 650 mg via ORAL
  Filled 2019-02-11: qty 2

## 2019-02-11 MED ORDER — HYDROCODONE-ACETAMINOPHEN 5-325 MG PO TABS: 1.0000 | ORAL_TABLET | ORAL | 0 refills | Status: DC | PRN

## 2019-02-11 MED ORDER — LIDOCAINE-EPINEPHRINE (PF) 2 %-1:200000 IJ SOLN
20.0000 mL | Freq: Once | INTRAMUSCULAR | Status: DC
  Filled 2019-02-11: qty 20

## 2019-02-11 NOTE — Discharge Instructions (Addendum)
Please read attached information. If you experience any new or worsening signs or symptoms please return to the emergency room for evaluation. Please follow-up with your primary care provider or specialist as discussed.  °

## 2019-02-11 NOTE — ED Notes (Signed)
Signature pad in room not working.

## 2019-02-11 NOTE — ED Provider Notes (Signed)
[facility] EMERGENCY DEPARTMENT Provider Note   CSN: [id number] Arrival date & time: 02/11/19  1304     History   Chief Complaint No chief complaint on file.   HPI Christie Peters is a 83 y.o. female.     HPI   83 year old female presents status post fall.  She had a mechanical fall shortly prior to arrival.  She struck her head.  No loss of consciousness no neurological deficits.  She denies any pain in her neck back hips or extremities.  She notes a superficial abrasion to the palm of her hand but no bony tenderness.  She is not on blood thinners and did not take aspirin yesterday.  She notes that her vision is normal.  She has pain to the left forehead with a laceration and bleeding.   Past Medical History:  Diagnosis Date   Dental crowns present    Dupuytren's contracture of left hand 09/2014   left middle finger   Hyperlipidemia    Hypertension    states under control with meds., has been on med. x 2 yr.    Patient Active Problem List   Diagnosis Date Noted   Sleep related hypoxia 06/12/2018   General medical examination 10/18/2010   Varicose veins 10/18/2010   TOBACCO ABUSE 06/15/2009   HYPERLIPIDEMIA 09/07/2006   HYPERTENSION 09/07/2006   COPD with emphysema ([place]) 09/07/2006   OSTEOPOROSIS 09/07/2006   Personal history of colonic adenomas 09/07/2006    Past Surgical History:  Procedure Laterality Date   BLEPHAROPLASTY Bilateral 02/17/2013   upper lid   CATARACT EXTRACTION W/ INTRAOCULAR LENS  IMPLANT, BILATERAL     CESAREAN SECTION     COLONOSCOPY WITH PROPOFOL  03/21/2013   FASCIOTOMY Left 10/01/2014   Procedure: FASCIOTOMY LEFT MIDDLE FINGER, FASCIOTOMY LEFT RING FINGER,  FASCIOTOMY LEFT SMALL FINGER ;  Surgeon: [name], MD;  Location: [facility];  Service: Orthopedics;  Laterality: Left;     OB History   No obstetric history on file.      Home Medications    Prior to Admission medications     Medication Sig Start Date End Date Taking? Authorizing Provider  albuterol (VENTOLIN HFA) 108 (90 Base) MCG/ACT inhaler Inhale 2 puffs into the lungs every 6 (six) hours as needed for wheezing or shortness of breath. 06/12/18   Chesley Mires, MD  benazepril (LOTENSIN) 20 MG tablet  09/20/16   [provider]  Calcium Carbonate-Vitamin D (CALTRATE 600+D PO) Take by mouth.    [provider]  Fluticasone-Umeclidin-Vilant (TRELEGY ELLIPTA) 100-62.5-25 MCG/INH AEPB Inhale 2 puffs into the lungs 2 (two) times daily.    [provider]  hydrochlorothiazide (HYDRODIURIL) 25 MG tablet TAKE ONE TABLET BY MOUTH EVERY DAY 12/21/11   Colon Branch, MD  HYDROcodone-acetaminophen (NORCO/VICODIN) 5-325 MG tablet Take 1 tablet by mouth every 4 (four) hours as needed. 02/11/19   Naydelin Ziegler, Dellis Filbert, PA-C  pravastatin (PRAVACHOL) 10 MG tablet Take 10 mg by mouth at bedtime. 12/12/16   [provider]    Family History Family History  Problem Relation Age of Onset   Stroke Mother    Diabetes Brother    Heart disease Brother        CABG   Breast cancer Sister    Birth defects Sister     Social History Social History   Tobacco Use   Smoking status: Former Smoker    Packs/day: 0.50    Years: 60.00  Pack years: 30.00    Quit date: 09/18/2013    Years since quitting: 5.4   Smokeless tobacco: Never Used   Tobacco comment: pt smoked off and on for about 60 years, reports stopping several times and then starting back  Substance Use Topics   Alcohol use: Yes    Comment: occasionally   Drug use: No     Allergies   Levofloxacin   Review of Systems Review of Systems  All other systems reviewed and are negative.  Physical Exam Updated Vital Signs BP (!) 160/76    Pulse 67    Temp 98.2 F (36.8 C) (Oral)    Resp 14    SpO2 95%   Physical Exam Vitals signs and nursing note reviewed.  Constitutional:      Appearance: She is well-developed.  HENT:     Head:  Normocephalic and atraumatic.     Comments: 3 cm star shaped laceration to the left forehead-decreased ability to raise left eyebrow-no underlying bony abnormality Eyes:     General: No scleral icterus.       Right eye: No discharge.        Left eye: No discharge.     Conjunctiva/sclera: Conjunctivae normal.     Pupils: Pupils are equal, round, and reactive to light.     Comments: Extraocular movements intact and pain-free  Neck:     Musculoskeletal: Normal range of motion.     Vascular: No JVD.     Trachea: No tracheal deviation.  Pulmonary:     Effort: Pulmonary effort is normal.     Breath sounds: No stridor.  Musculoskeletal:     Comments: No CT or L-spine tenderness palpation hip stable with AP and lateral compression bilateral upper and lower extremities nontender to palpation superficial abrasion noted on the thenar eminence of the left hand  Neurological:     General: No focal deficit present.     Mental Status: She is alert and oriented to person, place, and time.     Cranial Nerves: No cranial nerve deficit.     Sensory: No sensory deficit.     Motor: No weakness.     Coordination: Coordination normal.  Psychiatric:        Behavior: Behavior normal.        Thought Content: Thought content normal.        Judgment: Judgment normal.     ED Treatments / Results  Labs (all labs ordered are listed, but only abnormal results are displayed) Labs Reviewed  CBC WITH DIFFERENTIAL/PLATELET - Abnormal; Notable for the following components:      Result Value   RBC 3.74 (*)    MCV 100.5 (*)    MCH 34.5 (*)    All other components within normal limits  COMPREHENSIVE METABOLIC PANEL - Abnormal; Notable for the following components:   Sodium 133 (*)    Chloride 90 (*)    All other components within normal limits    EKG None  Radiology Ct Head Wo Contrast  Result Date: 02/11/2019 CLINICAL DATA:  Fall, tripped over curb EXAM: CT HEAD WITHOUT CONTRAST; CT MAXILLOFACIAL  WITHOUT CONTRAST; CT CERVICAL SPINE WITHOUT CONTRAST TECHNIQUE: Contiguous axial images were obtained from the base of the skull through the vertex without intravenous contrast. COMPARISON:  None. FINDINGS: Brain: No evidence of acute territorial infarction, hemorrhage, hydrocephalus,extra-axial collection or mass lesion/mass effect. There is dilatation the ventricles and sulci consistent with age-related atrophy. Low-attenuation changes in the deep white matter consistent  with small vessel ischemia. Vascular: No hyperdense vessel or unexpected calcification. Skull: The skull is intact. No fracture or focal lesion identified. Sinuses/Orbits: The visualized paranasal sinuses and mastoid air cells are clear. The orbits and globes intact. Other: There is a small laceration over the left frontal cortex with diffuse soft tissue swelling and a small soft tissue hematoma seen overlying the left frontal cortex tacks and periorbital regions. Face: Osseous: No acute fracture or other significant osseous abnormality.The nasal bone, mandibles, zygomatic arches and pterygoid plates are intact. Orbits: No fracture identified. Unremarkable appearance of globes and orbits. Sinuses: The visualized paranasal sinuses and mastoid air cells are unremarkable. Soft tissues: Small soft tissue laceration seen overlying the left frontal cortex with diffuse soft tissue swelling and periorbital swelling. A small amount of subcutaneous emphysema and a small soft tissue hematoma seen. Limited intracranial: No acute findings. Cervical spine: Alignment: There is a minimal retrolisthesis of C4 on C5 and C5 on C6. Skull base and vertebrae: Visualized skull base is intact. No atlanto-occipital dissociation. The vertebral body heights are well maintained. No fracture or pathologic osseous lesion seen. Soft tissues and spinal canal: The visualized paraspinal soft tissues are unremarkable. No prevertebral soft tissue swelling is seen. The spinal canal  is grossly unremarkable, no large epidural collection or significant canal narrowing. Disc levels: Multilevel degenerative changes are seen with disc osteophyte complex and uncovertebral osteophytes most notable at C5-C6 and C6-C7. Upper chest: The lung apices are clear. Thoracic inlet is within normal limits. Other: None IMPRESSION: No acute intracranial abnormality. Findings consistent with age related atrophy and chronic small vessel ischemia Left frontal soft tissue laceration with small hematoma and periorbital soft tissue swelling No acute facial fracture. No acute fracture or malalignment of the spine. Electronically Signed   By: Prudencio Pair M.D.   On: 02/11/2019 16:41   Ct Cervical Spine Wo Contrast  Result Date: 02/11/2019 CLINICAL DATA:  Fall, tripped over curb EXAM: CT HEAD WITHOUT CONTRAST; CT MAXILLOFACIAL WITHOUT CONTRAST; CT CERVICAL SPINE WITHOUT CONTRAST TECHNIQUE: Contiguous axial images were obtained from the base of the skull through the vertex without intravenous contrast. COMPARISON:  None. FINDINGS: Brain: No evidence of acute territorial infarction, hemorrhage, hydrocephalus,extra-axial collection or mass lesion/mass effect. There is dilatation the ventricles and sulci consistent with age-related atrophy. Low-attenuation changes in the deep white matter consistent with small vessel ischemia. Vascular: No hyperdense vessel or unexpected calcification. Skull: The skull is intact. No fracture or focal lesion identified. Sinuses/Orbits: The visualized paranasal sinuses and mastoid air cells are clear. The orbits and globes intact. Other: There is a small laceration over the left frontal cortex with diffuse soft tissue swelling and a small soft tissue hematoma seen overlying the left frontal cortex tacks and periorbital regions. Face: Osseous: No acute fracture or other significant osseous abnormality.The nasal bone, mandibles, zygomatic arches and pterygoid plates are intact. Orbits: No  fracture identified. Unremarkable appearance of globes and orbits. Sinuses: The visualized paranasal sinuses and mastoid air cells are unremarkable. Soft tissues: Small soft tissue laceration seen overlying the left frontal cortex with diffuse soft tissue swelling and periorbital swelling. A small amount of subcutaneous emphysema and a small soft tissue hematoma seen. Limited intracranial: No acute findings. Cervical spine: Alignment: There is a minimal retrolisthesis of C4 on C5 and C5 on C6. Skull base and vertebrae: Visualized skull base is intact. No atlanto-occipital dissociation. The vertebral body heights are well maintained. No fracture or pathologic osseous lesion seen. Soft tissues and spinal canal: The  visualized paraspinal soft tissues are unremarkable. No prevertebral soft tissue swelling is seen. The spinal canal is grossly unremarkable, no large epidural collection or significant canal narrowing. Disc levels: Multilevel degenerative changes are seen with disc osteophyte complex and uncovertebral osteophytes most notable at C5-C6 and C6-C7. Upper chest: The lung apices are clear. Thoracic inlet is within normal limits. Other: None IMPRESSION: No acute intracranial abnormality. Findings consistent with age related atrophy and chronic small vessel ischemia Left frontal soft tissue laceration with small hematoma and periorbital soft tissue swelling No acute facial fracture. No acute fracture or malalignment of the spine. Electronically Signed   By: Prudencio Pair M.D.   On: 02/11/2019 16:41   Ct Maxillofacial Wo Contrast  Result Date: 02/11/2019 CLINICAL DATA:  Fall, tripped over curb EXAM: CT HEAD WITHOUT CONTRAST; CT MAXILLOFACIAL WITHOUT CONTRAST; CT CERVICAL SPINE WITHOUT CONTRAST TECHNIQUE: Contiguous axial images were obtained from the base of the skull through the vertex without intravenous contrast. COMPARISON:  None. FINDINGS: Brain: No evidence of acute territorial infarction, hemorrhage,  hydrocephalus,extra-axial collection or mass lesion/mass effect. There is dilatation the ventricles and sulci consistent with age-related atrophy. Low-attenuation changes in the deep white matter consistent with small vessel ischemia. Vascular: No hyperdense vessel or unexpected calcification. Skull: The skull is intact. No fracture or focal lesion identified. Sinuses/Orbits: The visualized paranasal sinuses and mastoid air cells are clear. The orbits and globes intact. Other: There is a small laceration over the left frontal cortex with diffuse soft tissue swelling and a small soft tissue hematoma seen overlying the left frontal cortex tacks and periorbital regions. Face: Osseous: No acute fracture or other significant osseous abnormality.The nasal bone, mandibles, zygomatic arches and pterygoid plates are intact. Orbits: No fracture identified. Unremarkable appearance of globes and orbits. Sinuses: The visualized paranasal sinuses and mastoid air cells are unremarkable. Soft tissues: Small soft tissue laceration seen overlying the left frontal cortex with diffuse soft tissue swelling and periorbital swelling. A small amount of subcutaneous emphysema and a small soft tissue hematoma seen. Limited intracranial: No acute findings. Cervical spine: Alignment: There is a minimal retrolisthesis of C4 on C5 and C5 on C6. Skull base and vertebrae: Visualized skull base is intact. No atlanto-occipital dissociation. The vertebral body heights are well maintained. No fracture or pathologic osseous lesion seen. Soft tissues and spinal canal: The visualized paraspinal soft tissues are unremarkable. No prevertebral soft tissue swelling is seen. The spinal canal is grossly unremarkable, no large epidural collection or significant canal narrowing. Disc levels: Multilevel degenerative changes are seen with disc osteophyte complex and uncovertebral osteophytes most notable at C5-C6 and C6-C7. Upper chest: The lung apices are clear.  Thoracic inlet is within normal limits. Other: None IMPRESSION: No acute intracranial abnormality. Findings consistent with age related atrophy and chronic small vessel ischemia Left frontal soft tissue laceration with small hematoma and periorbital soft tissue swelling No acute facial fracture. No acute fracture or malalignment of the spine. Electronically Signed   By: Prudencio Pair M.D.   On: 02/11/2019 16:41    Procedures .Marland KitchenLaceration Repair  Date/Time: 02/11/2019 6:30 PM Performed by: Okey Regal, PA-C Authorized by: Okey Regal, PA-C   Consent:    Consent obtained:  Verbal   Consent given by:  Patient   Risks discussed:  Infection, need for additional repair, nerve damage, pain, poor cosmetic result and poor wound healing Anesthesia (see MAR for exact dosages):    Anesthesia method:  Local infiltration   Local anesthetic:  Lidocaine 2% WITH epi  Laceration details:    Location: forehead    Length (cm):  3 Repair type:    Repair type:  Intermediate Pre-procedure details:    Preparation:  Patient was prepped and draped in usual sterile fashion Exploration:    Wound exploration: wound explored through full range of motion and entire depth of wound probed and visualized     Wound extent: nerve damage     Wound extent: no foreign bodies/material noted, no tendon damage noted, no underlying fracture noted and no vascular damage noted     Contaminated: no   Treatment:    Area cleansed with:  Saline   Amount of cleaning:  Standard   Irrigation solution:  Sterile saline Skin repair:    Repair method:  Sutures   Suture size:  5-0   Suture material:  Prolene   Suture technique:  Simple interrupted   Number of sutures:  14 Approximation:    Approximation:  Close Post-procedure details:    Dressing:  Antibiotic ointment   Patient tolerance of procedure:  Tolerated well, no immediate complications   (including critical care time)  Medications Ordered in ED Medications    lidocaine-EPINEPHrine (XYLOCAINE W/EPI) 2 %-1:200000 (PF) injection 20 mL (has no administration in time range)  acetaminophen (TYLENOL) tablet 650 mg (650 mg Oral Given 02/11/19 1448)  Tdap (BOOSTRIX) injection 0.5 mL (0.5 mLs Intramuscular Given 02/11/19 1548)     Initial Impression / Assessment and Plan / ED Course  I have reviewed the triage vital signs and the nursing notes.  Pertinent labs & imaging results that were available during my care of the patient were reviewed by me and considered in my medical decision making (see chart for details).        Assessment/Plan: 83 year old female status post fall.  She has a laceration to her forehead that was repaired at bedside.  No acute intracranial abnormalities noted, no bony abnormalities.  Tetanus updated.  Discharged with strict return precautions and follow-up information.  Both patient and her daughter verbalized understanding and agreement to today's plan.  Final Clinical Impressions(s) / ED Diagnoses   Final diagnoses:  Fall, initial encounter  Injury of head, initial encounter  Facial laceration, initial encounter    ED Discharge Orders         Ordered    HYDROcodone-acetaminophen (NORCO/VICODIN) 5-325 MG tablet  Every 4 hours PRN     02/11/19 1803           Okey Regal, PA-C 02/11/19 1833    Maudie Flakes, MD 02/14/19 1556

## 2019-02-11 NOTE — ED Triage Notes (Signed)
Pt in after tripping on curb and landing on L side of head, 1/2 in lac present on L eyebrow. Denies any LOC, c/o HA and L hand pain. Not on thinners

## 2019-02-11 NOTE — ED Notes (Signed)
Patient verbalizes understanding of discharge instructions. Opportunity for questioning and answers were provided. Armband removed by staff, pt discharged from ED.  

## 2019-02-13 DIAGNOSIS — H43813 Vitreous degeneration, bilateral: Secondary | ICD-10-CM | POA: Diagnosis not present

## 2019-02-13 DIAGNOSIS — Z961 Presence of intraocular lens: Secondary | ICD-10-CM | POA: Diagnosis not present

## 2019-02-13 DIAGNOSIS — H1789 Other corneal scars and opacities: Secondary | ICD-10-CM | POA: Diagnosis not present

## 2019-02-13 DIAGNOSIS — H52203 Unspecified astigmatism, bilateral: Secondary | ICD-10-CM | POA: Diagnosis not present

## 2019-02-14 ENCOUNTER — Ambulatory Visit: Payer: Self-pay | Admitting: Cardiology

## 2019-02-14 ENCOUNTER — Encounter: Payer: Self-pay | Admitting: Cardiology

## 2019-02-14 ENCOUNTER — Other Ambulatory Visit: Payer: Self-pay

## 2019-02-14 ENCOUNTER — Ambulatory Visit (INDEPENDENT_AMBULATORY_CARE_PROVIDER_SITE_OTHER): Payer: Medicare HMO | Admitting: Cardiology

## 2019-02-14 VITALS — Ht 66.0 in | Wt 145.9 lb

## 2019-02-14 DIAGNOSIS — R55 Syncope and collapse: Secondary | ICD-10-CM | POA: Diagnosis not present

## 2019-02-14 DIAGNOSIS — I10 Essential (primary) hypertension: Secondary | ICD-10-CM

## 2019-02-14 NOTE — Progress Notes (Signed)
Patient referred by Leanna Battles, MD for near syncope.  Subjective:   Christie Peters, female    DOB: Nov 22, 1935, 83 y.o.   MRN: [id number]   Chief Complaint  Patient presents with  . Loss of Consciousness    pt ststaes she tripped and fell    HPI  83 y.o. Caucasian female with hypertension, COPD, sleep related hypoxia-on 2 L oxygen at night, referred for evaluation of an episode of near syncope.   Episode occurred on 02/09/19. Patient was on toilet bowl, staining to have a bowel movement is when she developed lightheadedness, blurry vision, severe headache, diaphoretic, shaky for about 15-20 min. Symptoms resolved after sitting down and relaxing. Patient has not had similar episode before or after this.   On 02/11/2019, patient was walking with grocery in her hands, when she tripped and fell-hitting her hear on the curb. She had a deep wound on her left forehead just above her eyebrow. She underwent laceration repair for this. She denies losing consciousness-before or after this particular episode.   At baseline, patient lives with her husband. She performs all her ADL's without diffculty. She does not do any regular exercise. She drinks <64 Oz water a day.    Past Medical History:  Diagnosis Date  . Dental crowns present   . Dupuytren's contracture of left hand 09/2014   left middle finger  . Hyperlipidemia   . Hypertension    states under control with meds., has been on med. x 2 yr.     Past Surgical History:  Procedure Laterality Date  . BLEPHAROPLASTY Bilateral 02/17/2013   upper lid  . CATARACT EXTRACTION W/ INTRAOCULAR LENS  IMPLANT, BILATERAL    . CESAREAN SECTION    . COLONOSCOPY WITH PROPOFOL  03/21/2013  . FASCIOTOMY Left 10/01/2014   Procedure: FASCIOTOMY LEFT MIDDLE FINGER, FASCIOTOMY LEFT RING FINGER,  FASCIOTOMY LEFT SMALL FINGER ;  Surgeon: [name], MD;  Location: [facility];  Service: Orthopedics;  Laterality: Left;     Social  History   Socioeconomic History  . Marital status: Married    Spouse name: Not on file  . Number of children: Not on file  . Years of education: Not on file  . Highest education level: Not on file  Occupational History  . Occupation: retired  Scientific laboratory technician  . Financial resource strain: Not on file  . Food insecurity    Worry: Not on file    Inability: Not on file  . Transportation needs    Medical: Not on file    Non-medical: Not on file  Tobacco Use  . Smoking status: Former Smoker    Packs/day: 0.50    Years: 60.00    Pack years: 30.00    Quit date: 09/18/2013    Years since quitting: 5.4  . Smokeless tobacco: Never Used  . Tobacco comment: pt smoked off and on for about 60 years, reports stopping several times and then starting back  Substance and Sexual Activity  . Alcohol use: Yes    Comment: occasionally  . Drug use: No  . Sexual activity: Not on file  Lifestyle  . Physical activity    Days per week: Not on file    Minutes per session: Not on file  . Stress: Not on file  Relationships  . Social Herbalist on phone: Not on file    Gets together: Not on file    Attends religious service: Not on file  Active member of club or organization: Not on file    Attends meetings of clubs or organizations: Not on file    Relationship status: Not on file  . Intimate partner violence    Fear of current or ex partner: Not on file    Emotionally abused: Not on file    Physically abused: Not on file    Forced sexual activity: Not on file  Other Topics Concern  . Not on file  Social History Narrative   4 children, one is decreased   Original from [place]   Exercise- no   Diet- she is careful w/ diet   Will spend 2 months in [place] this winter      Family History  Problem Relation Age of Onset  . Stroke Mother   . Diabetes Brother   . Heart disease Brother        CABG  . Breast cancer Sister   . Birth defects Sister      Current Outpatient  Medications on File Prior to Visit  Medication Sig Dispense Refill  . albuterol (VENTOLIN HFA) 108 (90 Base) MCG/ACT inhaler Inhale 2 puffs into the lungs every 6 (six) hours as needed for wheezing or shortness of breath. 1 Inhaler 5  . benazepril (LOTENSIN) 20 MG tablet     . Calcium Carbonate-Vitamin D (CALTRATE 600+D PO) Take by mouth.    . Fluticasone-Umeclidin-Vilant (TRELEGY ELLIPTA) 100-62.5-25 MCG/INH AEPB Inhale 2 puffs into the lungs 2 (two) times daily.    . hydrochlorothiazide (HYDRODIURIL) 25 MG tablet TAKE ONE TABLET BY MOUTH EVERY DAY 30 tablet 0  . HYDROcodone-acetaminophen (NORCO/VICODIN) 5-325 MG tablet Take 1 tablet by mouth every 4 (four) hours as needed. 6 tablet 0  . pravastatin (PRAVACHOL) 10 MG tablet Take 10 mg by mouth at bedtime.     No current facility-administered medications on file prior to visit.     Cardiovascular studies:   EKG 02/14/2019: Sinus rhythm 81 bpm. RSR(V1) -nondiagnostic.    Recent labs: 02/10/2019: Glucose 89. BUN/Cr 16/0.8. eGFR 69. Na/K 136/4.5. H/H 12.8/38.8. MCV 102. Platelets 253.   Review of Systems  Constitution: Positive for malaise/fatigue. Negative for decreased appetite, weight gain and weight loss.  HENT: Negative for congestion.   Eyes: Negative for visual disturbance.  Cardiovascular: Negative for chest pain, dyspnea on exertion, leg swelling, palpitations and syncope.  Respiratory: Negative for cough.   Endocrine: Negative for cold intolerance.  Hematologic/Lymphatic: Does not bruise/bleed easily.  Skin: Negative for itching and rash.  Musculoskeletal: Negative for myalgias.  Gastrointestinal: Negative for abdominal pain, nausea and vomiting.  Genitourinary: Negative for dysuria.  Neurological: Negative for dizziness and weakness.  Psychiatric/Behavioral: The patient is not nervous/anxious.   All other systems reviewed and are negative.       Orthostatic vitals: Supine: 148/80 mmHg, HR 80/min Sitting: 115/77  mmHg, HR 76/min standing: 128/70 mmHg, HR 87/min   Body mass index is 23.55 kg/m. Filed Weights   02/14/19 1243  Weight: 145 lb 14.4 oz (66.2 kg)     Objective:   Physical Exam  Constitutional: She is oriented to person, place, and time. She appears well-developed and well-nourished. No distress.  HENT:  Head: Normocephalic.  Swelling and sutures above left eyebrow, healing well  Eyes: Pupils are equal, round, and reactive to light. Conjunctivae are normal. Left eye exhibits hordeolum.  Neck: No JVD present.  Cardiovascular: Normal rate and regular rhythm. Exam reveals decreased pulses.  Pulmonary/Chest: Effort normal and breath sounds normal. She has  no wheezes. She has no rales.  Abdominal: Soft. Bowel sounds are normal. There is no rebound.  Musculoskeletal:        General: No edema.  Lymphadenopathy:    She has no cervical adenopathy.  Neurological: She is alert and oriented to person, place, and time. No cranial nerve deficit.  Skin: Skin is warm and dry.  Psychiatric: She has a normal mood and affect.  Nursing note and vitals reviewed.         Assessment & Recommendations:   83 y.o. Caucasian female with hypertension, COPD, sleep related hypoxia-on 2 L oxygen at night, referred for evaluation of an episode of near syncope.   Near syncope: Episode most likely vasovagal syncope, associated with straining during defecation. She is on modest dose of diuretic. That couples with her low water intake could be a contributing factor. She is minimally orthostatic today with supine hypertension. Encouraged increasing water intake. She has high MCV with low normal Hb. This could also be a precipitating factor and should be investigated, defer to PCP. Her episode of fall two days after the near syncope episode was purely mechanical. Her physical exam and EKG are normal. No cardiac testing necessary at this time. I will see her back in four weeks. If any recurrence of near syncope  symptoms, I would then recommend echocardiogram and event monitor.   Thank you for referring the patient to [place]. Please feel free to contact with any questions.  Nigel Mormon, MD [facility] Pager: [phone] Office: [phone] If no answer Cell [phone]

## 2019-02-15 ENCOUNTER — Encounter: Payer: Self-pay | Admitting: Cardiology

## 2019-02-17 DIAGNOSIS — S0191XD Laceration without foreign body of unspecified part of head, subsequent encounter: Secondary | ICD-10-CM | POA: Diagnosis not present

## 2019-02-17 DIAGNOSIS — R42 Dizziness and giddiness: Secondary | ICD-10-CM | POA: Diagnosis not present

## 2019-02-17 DIAGNOSIS — J449 Chronic obstructive pulmonary disease, unspecified: Secondary | ICD-10-CM | POA: Diagnosis not present

## 2019-02-17 DIAGNOSIS — D7589 Other specified diseases of blood and blood-forming organs: Secondary | ICD-10-CM | POA: Diagnosis not present

## 2019-02-18 DIAGNOSIS — S0191XD Laceration without foreign body of unspecified part of head, subsequent encounter: Secondary | ICD-10-CM | POA: Diagnosis not present

## 2019-02-18 DIAGNOSIS — I951 Orthostatic hypotension: Secondary | ICD-10-CM | POA: Diagnosis not present

## 2019-02-18 DIAGNOSIS — Z4802 Encounter for removal of sutures: Secondary | ICD-10-CM | POA: Diagnosis not present

## 2019-02-18 DIAGNOSIS — R42 Dizziness and giddiness: Secondary | ICD-10-CM | POA: Diagnosis not present

## 2019-03-19 ENCOUNTER — Telehealth: Payer: Medicare HMO | Admitting: Cardiology

## 2019-04-03 DIAGNOSIS — M859 Disorder of bone density and structure, unspecified: Secondary | ICD-10-CM | POA: Diagnosis not present

## 2019-04-03 DIAGNOSIS — E7849 Other hyperlipidemia: Secondary | ICD-10-CM | POA: Diagnosis not present

## 2019-04-10 DIAGNOSIS — E785 Hyperlipidemia, unspecified: Secondary | ICD-10-CM | POA: Diagnosis not present

## 2019-04-10 DIAGNOSIS — Z0000 Encounter for general adult medical examination without abnormal findings: Secondary | ICD-10-CM | POA: Diagnosis not present

## 2019-04-10 DIAGNOSIS — H6121 Impacted cerumen, right ear: Secondary | ICD-10-CM | POA: Diagnosis not present

## 2019-04-10 DIAGNOSIS — R82998 Other abnormal findings in urine: Secondary | ICD-10-CM | POA: Diagnosis not present

## 2019-04-10 DIAGNOSIS — R2681 Unsteadiness on feet: Secondary | ICD-10-CM | POA: Insufficient documentation

## 2019-04-10 DIAGNOSIS — R269 Unspecified abnormalities of gait and mobility: Secondary | ICD-10-CM | POA: Insufficient documentation

## 2019-04-10 DIAGNOSIS — J449 Chronic obstructive pulmonary disease, unspecified: Secondary | ICD-10-CM | POA: Diagnosis not present

## 2019-04-10 DIAGNOSIS — I739 Peripheral vascular disease, unspecified: Secondary | ICD-10-CM | POA: Diagnosis not present

## 2019-04-10 DIAGNOSIS — M810 Age-related osteoporosis without current pathological fracture: Secondary | ICD-10-CM | POA: Diagnosis not present

## 2019-04-10 DIAGNOSIS — Z1339 Encounter for screening examination for other mental health and behavioral disorders: Secondary | ICD-10-CM | POA: Diagnosis not present

## 2019-04-10 DIAGNOSIS — R600 Localized edema: Secondary | ICD-10-CM | POA: Diagnosis not present

## 2019-04-10 DIAGNOSIS — I10 Essential (primary) hypertension: Secondary | ICD-10-CM | POA: Diagnosis not present

## 2019-04-23 DIAGNOSIS — R262 Difficulty in walking, not elsewhere classified: Secondary | ICD-10-CM | POA: Diagnosis not present

## 2019-04-23 DIAGNOSIS — M6281 Muscle weakness (generalized): Secondary | ICD-10-CM | POA: Diagnosis not present

## 2019-04-23 DIAGNOSIS — R269 Unspecified abnormalities of gait and mobility: Secondary | ICD-10-CM | POA: Diagnosis not present

## 2019-04-23 DIAGNOSIS — M79661 Pain in right lower leg: Secondary | ICD-10-CM | POA: Diagnosis not present

## 2019-04-28 DIAGNOSIS — M79661 Pain in right lower leg: Secondary | ICD-10-CM | POA: Diagnosis not present

## 2019-04-28 DIAGNOSIS — R262 Difficulty in walking, not elsewhere classified: Secondary | ICD-10-CM | POA: Diagnosis not present

## 2019-04-28 DIAGNOSIS — R269 Unspecified abnormalities of gait and mobility: Secondary | ICD-10-CM | POA: Diagnosis not present

## 2019-04-28 DIAGNOSIS — M6281 Muscle weakness (generalized): Secondary | ICD-10-CM | POA: Diagnosis not present

## 2019-05-05 DIAGNOSIS — M79661 Pain in right lower leg: Secondary | ICD-10-CM | POA: Diagnosis not present

## 2019-05-05 DIAGNOSIS — M6281 Muscle weakness (generalized): Secondary | ICD-10-CM | POA: Diagnosis not present

## 2019-05-05 DIAGNOSIS — R262 Difficulty in walking, not elsewhere classified: Secondary | ICD-10-CM | POA: Diagnosis not present

## 2019-05-05 DIAGNOSIS — R269 Unspecified abnormalities of gait and mobility: Secondary | ICD-10-CM | POA: Diagnosis not present

## 2019-05-09 ENCOUNTER — Other Ambulatory Visit: Payer: Self-pay | Admitting: Internal Medicine

## 2019-05-09 DIAGNOSIS — Z1231 Encounter for screening mammogram for malignant neoplasm of breast: Secondary | ICD-10-CM

## 2019-05-13 DIAGNOSIS — R69 Illness, unspecified: Secondary | ICD-10-CM | POA: Diagnosis not present

## 2019-05-19 DIAGNOSIS — R69 Illness, unspecified: Secondary | ICD-10-CM | POA: Diagnosis not present

## 2019-06-19 ENCOUNTER — Ambulatory Visit: Payer: Medicare HMO

## 2019-06-27 ENCOUNTER — Ambulatory Visit: Payer: Medicare HMO

## 2019-07-17 ENCOUNTER — Other Ambulatory Visit: Payer: Self-pay

## 2019-07-17 ENCOUNTER — Ambulatory Visit: Payer: Medicare HMO | Admitting: Podiatry

## 2019-07-17 DIAGNOSIS — L600 Ingrowing nail: Secondary | ICD-10-CM

## 2019-07-17 MED ORDER — NEOMYCIN-POLYMYXIN-HC 1 % OT SOLN: OTIC | 1 refills | Status: DC

## 2019-07-17 NOTE — Patient Instructions (Signed)

## 2019-07-17 NOTE — Progress Notes (Signed)
Subjective:  Patient ID: Christie Peters, female    DOB: 1936/03/02,  MRN: [id number] HPI Chief Complaint  Patient presents with  . New Patient (Initial Visit)  . Nail Problem    left hallux medial, right 2nd digit medial. ingrown. has had gotten pedicures to maintain them. sharp pain causing discomfort while walking     84 y.o. female presents with the above complaint.   ROS: Denies fever chills nausea vomiting muscle aches pains calf pain back pain chest pain shortness of breath.  Past Medical History:  Diagnosis Date  . Dental crowns present   . Dupuytren's contracture of left hand 09/2014   left middle finger  . Hyperlipidemia   . Hypertension    states under control with meds., has been on med. x 2 yr.   Past Surgical History:  Procedure Laterality Date  . BLEPHAROPLASTY Bilateral 02/17/2013   upper lid  . CATARACT EXTRACTION W/ INTRAOCULAR LENS  IMPLANT, BILATERAL    . CESAREAN SECTION    . COLONOSCOPY WITH PROPOFOL  03/21/2013  . FASCIOTOMY Left 10/01/2014   Procedure: FASCIOTOMY LEFT MIDDLE FINGER, FASCIOTOMY LEFT RING FINGER,  FASCIOTOMY LEFT SMALL FINGER ;  Surgeon: [name], MD;  Location: [facility];  Service: Orthopedics;  Laterality: Left;    Current Outpatient Medications:  .  albuterol (VENTOLIN HFA) 108 (90 Base) MCG/ACT inhaler, Inhale 2 puffs into the lungs every 6 (six) hours as needed for wheezing or shortness of breath., Disp: 1 Inhaler, Rfl: 5 .  benazepril (LOTENSIN) 20 MG tablet, , Disp: , Rfl:  .  Calcium Carbonate-Vitamin D (CALTRATE 600+D PO), Take by mouth., Disp: , Rfl:  .  Fluticasone-Umeclidin-Vilant (TRELEGY ELLIPTA) 100-62.5-25 MCG/INH AEPB, Inhale 2 puffs into the lungs 2 (two) times daily., Disp: , Rfl:  .  hydrochlorothiazide (HYDRODIURIL) 25 MG tablet, TAKE ONE TABLET BY MOUTH EVERY DAY, Disp: 30 tablet, Rfl: 0 .  HYDROcodone-acetaminophen (NORCO/VICODIN) 5-325 MG tablet, Take 1 tablet by mouth every 4 (four) hours as  needed. (Patient not taking: Reported on 02/14/2019), Disp: 6 tablet, Rfl: 0 .  NEOMYCIN-POLYMYXIN-HYDROCORTISONE (CORTISPORIN) 1 % SOLN OTIC solution, Apply 1-2 drops to toe BID after soaking, Disp: 10 mL, Rfl: 1 .  pravastatin (PRAVACHOL) 10 MG tablet, Take 10 mg by mouth at bedtime., Disp: , Rfl:  .  Propylene Glycol 0.95 % SOLN, Place 1 drop into both eyes daily., Disp: , Rfl:   Allergies  Allergen Reactions  . Levofloxacin Itching and Rash   Review of Systems Objective:  There were no vitals filed for this visit.  General: Well developed, nourished, in no acute distress, alert and oriented x3   Dermatological: Skin is warm, dry and supple bilateral. Nails x 10 are well maintained; remaining integument appears unremarkable at this time. There are no open sores, no preulcerative lesions, no rash or signs of infection present.  Vascular: Dorsalis Pedis artery and Posterior Tibial artery pedal pulses are 2/4 bilateral with immedate capillary fill time. Pedal hair growth present. No varicosities and no lower extremity edema present bilateral.   Neruologic: Grossly intact via light touch bilateral. Vibratory intact via tuning fork bilateral. Protective threshold with Semmes Wienstein monofilament intact to all pedal sites bilateral. Patellar and Achilles deep tendon reflexes 2+ bilateral. No Babinski or clonus noted bilateral.   Musculoskeletal: No gross boney pedal deformities bilateral. No pain, crepitus, or limitation noted with foot and ankle range of motion bilateral. Muscular strength 5/5 in all groups tested bilateral.  Gait: Unassisted, Nonantalgic.  Radiographs:  None taken  Assessment & Plan:   Assessment: Ingrown toenail tibial border second digit right ingrown toenail tibial border hallux left  Plan: Chemical matrixectomy tibial border second digit right tibial border hallux left.  She tolerated procedure well after local anesthetic was administered she was provided with  both oral and home-going instruction for the care and soaking of the toes.  She will follow-up with [place] in about 3 weeks for reevaluation.  She was also provided a prescription for Cortisporin Otic to be applied twice daily after soaking.     Dhamar Gregory T. [place], [place]

## 2019-08-04 DIAGNOSIS — Z79899 Other long term (current) drug therapy: Secondary | ICD-10-CM | POA: Diagnosis not present

## 2019-08-04 DIAGNOSIS — Z803 Family history of malignant neoplasm of breast: Secondary | ICD-10-CM | POA: Diagnosis not present

## 2019-08-04 DIAGNOSIS — Z823 Family history of stroke: Secondary | ICD-10-CM | POA: Diagnosis not present

## 2019-08-04 DIAGNOSIS — I739 Peripheral vascular disease, unspecified: Secondary | ICD-10-CM | POA: Diagnosis not present

## 2019-08-04 DIAGNOSIS — Z8249 Family history of ischemic heart disease and other diseases of the circulatory system: Secondary | ICD-10-CM | POA: Diagnosis not present

## 2019-08-04 DIAGNOSIS — E785 Hyperlipidemia, unspecified: Secondary | ICD-10-CM | POA: Diagnosis not present

## 2019-08-04 DIAGNOSIS — Z008 Encounter for other general examination: Secondary | ICD-10-CM | POA: Diagnosis not present

## 2019-08-04 DIAGNOSIS — M810 Age-related osteoporosis without current pathological fracture: Secondary | ICD-10-CM | POA: Diagnosis not present

## 2019-08-04 DIAGNOSIS — I10 Essential (primary) hypertension: Secondary | ICD-10-CM | POA: Diagnosis not present

## 2019-08-04 DIAGNOSIS — J439 Emphysema, unspecified: Secondary | ICD-10-CM | POA: Diagnosis not present

## 2019-08-04 DIAGNOSIS — Z7951 Long term (current) use of inhaled steroids: Secondary | ICD-10-CM | POA: Diagnosis not present

## 2019-08-07 ENCOUNTER — Ambulatory Visit: Payer: Medicare HMO | Admitting: Podiatry

## 2019-08-07 ENCOUNTER — Other Ambulatory Visit: Payer: Self-pay

## 2019-08-07 DIAGNOSIS — Z9889 Other specified postprocedural states: Secondary | ICD-10-CM

## 2019-08-07 DIAGNOSIS — L600 Ingrowing nail: Secondary | ICD-10-CM

## 2019-08-07 NOTE — Progress Notes (Signed)
She presents today for a nail check of the hallux left states that is been a lot of improvement and declines any pain.  Objective: Vital signs are stable she is alert and oriented x3.  There is no erythema edema cellulitis drainage or odor to the hallux left or second toe of the right foot.  Assessment: Well-healing surgical toes.  Plan: Follow-up with me on an as-needed basis.

## 2019-08-15 DIAGNOSIS — H52203 Unspecified astigmatism, bilateral: Secondary | ICD-10-CM | POA: Diagnosis not present

## 2019-08-15 DIAGNOSIS — H43813 Vitreous degeneration, bilateral: Secondary | ICD-10-CM | POA: Diagnosis not present

## 2019-08-15 DIAGNOSIS — H35373 Puckering of macula, bilateral: Secondary | ICD-10-CM | POA: Diagnosis not present

## 2019-08-26 ENCOUNTER — Other Ambulatory Visit (HOSPITAL_COMMUNITY): Payer: Self-pay | Admitting: *Deleted

## 2019-08-27 ENCOUNTER — Other Ambulatory Visit: Payer: Self-pay

## 2019-08-27 ENCOUNTER — Ambulatory Visit (HOSPITAL_COMMUNITY)
Admission: RE | Admit: 2019-08-27 | Discharge: 2019-08-27 | Disposition: A | Discharge status: Home / Self Care | Payer: Medicare HMO | Source: Ambulatory Visit | Attending: Internal Medicine | Admitting: Internal Medicine

## 2019-08-27 DIAGNOSIS — M810 Age-related osteoporosis without current pathological fracture: Secondary | ICD-10-CM | POA: Insufficient documentation

## 2019-08-27 MED ORDER — DENOSUMAB 60 MG/ML ~~LOC~~ SOSY
PREFILLED_SYRINGE | SUBCUTANEOUS | Status: AC
  Administered 2019-08-27: 60 mg via SUBCUTANEOUS
  Filled 2019-08-27: qty 1

## 2019-08-27 MED ORDER — DENOSUMAB 60 MG/ML ~~LOC~~ SOSY: 60.0000 mg | PREFILLED_SYRINGE | Freq: Once | SUBCUTANEOUS | Status: AC

## 2019-08-28 ENCOUNTER — Other Ambulatory Visit: Payer: Self-pay | Admitting: *Deleted

## 2019-08-28 DIAGNOSIS — I739 Peripheral vascular disease, unspecified: Secondary | ICD-10-CM

## 2019-09-03 ENCOUNTER — Other Ambulatory Visit: Payer: Self-pay

## 2019-09-03 ENCOUNTER — Ambulatory Visit (INDEPENDENT_AMBULATORY_CARE_PROVIDER_SITE_OTHER): Payer: Medicare HMO | Admitting: Physician Assistant

## 2019-09-03 ENCOUNTER — Ambulatory Visit (HOSPITAL_COMMUNITY)
Admission: RE | Admit: 2019-09-03 | Discharge: 2019-09-03 | Disposition: A | Discharge status: Home / Self Care | Payer: Medicare HMO | Source: Ambulatory Visit | Attending: Vascular Surgery | Admitting: Vascular Surgery

## 2019-09-03 VITALS — BP 117/75 | HR 71 | Temp 98.2°F | Resp 16 | Ht 66.0 in | Wt 143.6 lb

## 2019-09-03 DIAGNOSIS — I739 Peripheral vascular disease, unspecified: Secondary | ICD-10-CM

## 2019-09-03 NOTE — Progress Notes (Signed)
.vvs  Office Note     CC:  follow up Requesting Provider:  Leanna Battles, MD  HPI: Christie Peters is a 84 y.o. (1936-04-29) female who presents for follow-up peripheral arterial disease.  In the past she has moderate disease of the right lower extremity demonstrated by ABIs.  She has had no previous intervention or revascularization.  Currently complains of varicose veins and purple discolored veins of both feet.  She has lower extremity discomfort when standing.  Does not have claudication type symptoms.  No rest pain.  Her activity level has been decreased since shelter in place orders were placed during the pandemic.  She wears her compression stockings when shopping and traveling.  The pt is on a statin for cholesterol management.  The pt is not on a daily aspirin.   Other AC:  none The pt is on ACEI, diuretic for hypertension.   The pt is not diabetic.   Tobacco hx:  38 pyh; as currently stopped  Past Medical History:  Diagnosis Date  . Dental crowns present   . Dupuytren's contracture of left hand 09/2014   left middle finger  . Hyperlipidemia   . Hypertension    states under control with meds., has been on med. x 2 yr.    Past Surgical History:  Procedure Laterality Date  . BLEPHAROPLASTY Bilateral 02/17/2013   upper lid  . CATARACT EXTRACTION W/ INTRAOCULAR LENS  IMPLANT, BILATERAL    . CESAREAN SECTION    . COLONOSCOPY WITH PROPOFOL  03/21/2013  . FASCIOTOMY Left 10/01/2014   Procedure: FASCIOTOMY LEFT MIDDLE FINGER, FASCIOTOMY LEFT RING FINGER,  FASCIOTOMY LEFT SMALL FINGER ;  Surgeon: [name], MD;  Location: [facility];  Service: Orthopedics;  Laterality: Left;    Social History   Socioeconomic History  . Marital status: Married    Spouse name: Not on file  . Number of children: Not on file  . Years of education: Not on file  . Highest education level: Not on file  Occupational History  . Occupation: retired  Tobacco Use  . Smoking  status: Former Smoker    Packs/day: 0.50    Years: 60.00    Pack years: 30.00    Quit date: 09/18/2013    Years since quitting: 5.9  . Smokeless tobacco: Never Used  . Tobacco comment: pt smoked off and on for about 60 years, reports stopping several times and then starting back  Substance and Sexual Activity  . Alcohol use: Yes    Comment: occasionally  . Drug use: No  . Sexual activity: Not on file  Other Topics Concern  . Not on file  Social History Narrative   4 children, one is decreased   Original from [place]   Exercise- no   Diet- she is careful w/ diet   Will spend 2 months in [place] this winter    Social Determinants of Health   Financial Resource Strain:   . Difficulty of Paying Living Expenses:   Food Insecurity:   . Worried About Charity fundraiser in the Last Year:   . Arboriculturist in the Last Year:   Transportation Needs:   . Film/video editor (Medical):   Marland Kitchen Lack of Transportation (Non-Medical):   Physical Activity:   . Days of Exercise per Week:   . Minutes of Exercise per Session:   Stress:   . Feeling of Stress :   Social Connections:   . Frequency of Communication  with Friends and Family:   . Frequency of Social Gatherings with Friends and Family:   . Attends Religious Services:   . Active Member of Clubs or Organizations:   . Attends Archivist Meetings:   Marland Kitchen Marital Status:   Intimate Partner Violence:   . Fear of Current or Ex-Partner:   . Emotionally Abused:   Marland Kitchen Physically Abused:   . Sexually Abused:     Family History  Problem Relation Age of Onset  . Stroke Mother   . Diabetes Brother   . Heart disease Brother        CABG  . Breast cancer Sister   . Birth defects Sister     Current Outpatient Medications  Medication Sig Dispense Refill  . albuterol (VENTOLIN HFA) 108 (90 Base) MCG/ACT inhaler Inhale 2 puffs into the lungs every 6 (six) hours as needed for wheezing or shortness of breath. 1 Inhaler 5  .  benazepril (LOTENSIN) 20 MG tablet     . Calcium Carbonate-Vitamin D (CALTRATE 600+D PO) Take by mouth.    . Fluticasone-Umeclidin-Vilant (TRELEGY ELLIPTA) 100-62.5-25 MCG/INH AEPB Inhale 2 puffs into the lungs 2 (two) times daily.    . hydrochlorothiazide (HYDRODIURIL) 25 MG tablet TAKE ONE TABLET BY MOUTH EVERY DAY 30 tablet 0  . HYDROcodone-acetaminophen (NORCO/VICODIN) 5-325 MG tablet Take 1 tablet by mouth every 4 (four) hours as needed. (Patient not taking: Reported on 02/14/2019) 6 tablet 0  . NEOMYCIN-POLYMYXIN-HYDROCORTISONE (CORTISPORIN) 1 % SOLN OTIC solution Apply 1-2 drops to toe BID after soaking 10 mL 1  . pravastatin (PRAVACHOL) 10 MG tablet Take 10 mg by mouth at bedtime.    Marland Kitchen Propylene Glycol 0.95 % SOLN Place 1 drop into both eyes daily.     No current facility-administered medications for this visit.    Allergies  Allergen Reactions  . Levofloxacin Itching and Rash     REVIEW OF SYSTEMS:   [X]  denotes positive finding, [ ]  denotes negative finding Cardiac  Comments:  Chest pain or chest pressure:    Shortness of breath upon exertion:    Short of breath when lying flat:    Irregular heart rhythm:        Vascular    Pain in calf, thigh, or hip brought on by ambulation:    Pain in feet at night that wakes you up from your sleep:     Blood clot in your veins:    Leg swelling:         Pulmonary    Oxygen at home:    Productive cough:     Wheezing:         Neurologic    Sudden weakness in arms or legs:     Sudden numbness in arms or legs:     Sudden onset of difficulty speaking or slurred speech:    Temporary loss of vision in one eye:     Problems with dizziness:         Gastrointestinal    Blood in stool:     Vomited blood:         Genitourinary    Burning when urinating:     Blood in urine:        Psychiatric    Major depression:         Hematologic    Bleeding problems:    Problems with blood clotting too easily:        Skin    Rashes or  ulcers:  Constitutional    Fever or chills:      PHYSICAL EXAMINATION:  Vitals:   09/03/19 1448  Weight: 143 lb 9.6 oz (65.1 kg)  Height: 5\' 6"  (1.676 m)   General:  WDWN in NAD; vital signs documented above Gait: No ataxia, walks unaided HENT: WNL, normocephalic Pulmonary: normal non-labored breathing , without Rales, rhonchi,  wheezing Cardiac: regular HR, without  Murmurs without carotid bruits Abdomen: soft, NT, no masses Skin: without rashes Vascular Exam/Pulses: Extremities: without ischemic changes, without Gangrene , without cellulitis; without open wounds; the patient has bilateral varicosities and extensive reticular veins of both feet. Musculoskeletal: no muscle wasting or atrophy  Neurologic: A&O X 3;  No focal weakness or paresthesias are detected Psychiatric:  The pt has Normal affect.  Pulse exam: The patient has 2+ palpable brachial, radial, femoral pulses. Doppler signals of dorsalis pedis, posterior tibial and right peroneal arteries.  Non-Invasive Vascular Imaging:    ABI/TBIToday's ABIToday's TBIPrevious ABIPrevious TBI  +-------+-----------+-----------+------------+------------+  Right 0.76    0.46    0.64    0.43      +-------+-----------+-----------+------------+------------+  Left  0.57    0.37    0.91    0.73        ASSESSMENT/PLAN:: 84 y.o. female here for follow up for moderate peripheral arterial disease.  We discussed claudication and the changes in her ABIs from previous exam.  We discussed getting back to a regular walking routine, wearing her compression hose and elevating her legs in the evenings.  Recommend follow-up in 6 months due to change in ABIs of the left lower extremity.  The patient is in agreement with this plan    Barbie Banner, PA-C [facility] [phone]  Clinic MD:   Scot Dock

## 2019-09-04 ENCOUNTER — Other Ambulatory Visit: Payer: Self-pay | Admitting: *Deleted

## 2019-09-04 DIAGNOSIS — I739 Peripheral vascular disease, unspecified: Secondary | ICD-10-CM

## 2019-10-09 DIAGNOSIS — I10 Essential (primary) hypertension: Secondary | ICD-10-CM | POA: Diagnosis not present

## 2019-10-09 DIAGNOSIS — J449 Chronic obstructive pulmonary disease, unspecified: Secondary | ICD-10-CM | POA: Diagnosis not present

## 2019-10-09 DIAGNOSIS — R2681 Unsteadiness on feet: Secondary | ICD-10-CM | POA: Diagnosis not present

## 2019-10-09 DIAGNOSIS — I739 Peripheral vascular disease, unspecified: Secondary | ICD-10-CM | POA: Diagnosis not present

## 2019-11-17 DIAGNOSIS — Z961 Presence of intraocular lens: Secondary | ICD-10-CM | POA: Diagnosis not present

## 2019-11-17 DIAGNOSIS — H1789 Other corneal scars and opacities: Secondary | ICD-10-CM | POA: Diagnosis not present

## 2019-11-17 DIAGNOSIS — H43813 Vitreous degeneration, bilateral: Secondary | ICD-10-CM | POA: Diagnosis not present

## 2019-11-17 DIAGNOSIS — H35373 Puckering of macula, bilateral: Secondary | ICD-10-CM | POA: Diagnosis not present

## 2020-01-08 ENCOUNTER — Other Ambulatory Visit: Payer: Self-pay

## 2020-01-08 ENCOUNTER — Ambulatory Visit: Payer: Medicare HMO | Admitting: Family Medicine

## 2020-01-08 DIAGNOSIS — M5030 Other cervical disc degeneration, unspecified cervical region: Secondary | ICD-10-CM

## 2020-01-08 NOTE — Progress Notes (Signed)
[facility] [place] Phone: [phone] Subjective:   Fontaine No, am serving as a scribe for Dr. Hulan Saas. This visit occurred during the SARS-CoV-2 public health emergency.  Safety protocols were in place, including screening questions prior to the visit, additional usage of staff PPE, and extensive cleaning of exam room while observing appropriate contact time as indicated for disinfecting solutions.   I'm seeing this patient by the request  of:  Leanna Battles, MD  CC: Neck pain  BOF:BPZWCHENID  Christie Peters is a 84 y.o. female coming in with complaint of neck pain. Patient states that she is having left sided neck pain for past 2 weeks. Pain with rotation. Denies any radiating symptoms. Using Aleve for pain relief.  Patient states that it is very uncomfortable.  Symptoms very uncomfortable at night.  Denies any radiation down the arm or any numbness or tingling.  Rates the severity of pain a 7-10  CT cervical after a fall in September 2020.  CT was independently visualized by me 02/11/2019 CT scan showed of the neck a fairly large osteophyte complex with multilevel changes most notably from C5 through C7.  Past Medical History:  Diagnosis Date  . Dental crowns present   . Dupuytren's contracture of left hand 09/2014   left middle finger  . Hyperlipidemia   . Hypertension    states under control with meds., has been on med. x 2 yr.   Past Surgical History:  Procedure Laterality Date  . BLEPHAROPLASTY Bilateral 02/17/2013   upper lid  . CATARACT EXTRACTION W/ INTRAOCULAR LENS  IMPLANT, BILATERAL    . CESAREAN SECTION    . COLONOSCOPY WITH PROPOFOL  03/21/2013  . FASCIOTOMY Left 10/01/2014   Procedure: FASCIOTOMY LEFT MIDDLE FINGER, FASCIOTOMY LEFT RING FINGER,  FASCIOTOMY LEFT SMALL FINGER ;  Surgeon: [name], MD;  Location: [facility];  Service: Orthopedics;  Laterality: Left;   Social  History   Socioeconomic History  . Marital status: Married    Spouse name: Not on file  . Number of children: Not on file  . Years of education: Not on file  . Highest education level: Not on file  Occupational History  . Occupation: retired  Tobacco Use  . Smoking status: Former Smoker    Packs/day: 0.50    Years: 60.00    Pack years: 30.00    Quit date: 09/18/2013    Years since quitting: 6.3  . Smokeless tobacco: Never Used  . Tobacco comment: pt smoked off and on for about 60 years, reports stopping several times and then starting back  Vaping Use  . Vaping Use: Never used  Substance and Sexual Activity  . Alcohol use: Yes    Comment: occasionally  . Drug use: No  . Sexual activity: Not on file  Other Topics Concern  . Not on file  Social History Narrative   4 children, one is decreased   Original from [place]   Exercise- no   Diet- she is careful w/ diet   Will spend 2 months in [place] this winter    Social Determinants of Health   Financial Resource Strain:   . Difficulty of Paying Living Expenses: Not on file  Food Insecurity:   . Worried About Charity fundraiser in the Last Year: Not on file  . Ran Out of Food in the Last Year: Not on file  Transportation Needs:   . Lack  of Transportation (Medical): Not on file  . Lack of Transportation (Non-Medical): Not on file  Physical Activity:   . Days of Exercise per Week: Not on file  . Minutes of Exercise per Session: Not on file  Stress:   . Feeling of Stress : Not on file  Social Connections:   . Frequency of Communication with Friends and Family: Not on file  . Frequency of Social Gatherings with Friends and Family: Not on file  . Attends Religious Services: Not on file  . Active Member of Clubs or Organizations: Not on file  . Attends Archivist Meetings: Not on file  . Marital Status: Not on file   Allergies  Allergen Reactions  . Levofloxacin Itching and Rash   Family History  Problem  Relation Age of Onset  . Stroke Mother   . Diabetes Brother   . Heart disease Brother        CABG  . Breast cancer Sister   . Birth defects Sister      Current Outpatient Medications (Cardiovascular):  .  benazepril (LOTENSIN) 20 MG tablet,  .  hydrochlorothiazide (HYDRODIURIL) 25 MG tablet, TAKE ONE TABLET BY MOUTH EVERY DAY .  pravastatin (PRAVACHOL) 10 MG tablet, Take 10 mg by mouth at bedtime.  Current Outpatient Medications (Respiratory):  .  albuterol (VENTOLIN HFA) 108 (90 Base) MCG/ACT inhaler, Inhale 2 puffs into the lungs every 6 (six) hours as needed for wheezing or shortness of breath. .  Fluticasone-Umeclidin-Vilant (TRELEGY ELLIPTA) 100-62.5-25 MCG/INH AEPB, Inhale 2 puffs into the lungs 2 (two) times daily.  Current Outpatient Medications (Analgesics):  .  HYDROcodone-acetaminophen (NORCO/VICODIN) 5-325 MG tablet, Take 1 tablet by mouth every 4 (four) hours as needed.   Current Outpatient Medications (Other):  Marland Kitchen  Calcium Carbonate-Vitamin D (CALTRATE 600+D PO), Take by mouth. .  NEOMYCIN-POLYMYXIN-HYDROCORTISONE (CORTISPORIN) 1 % SOLN OTIC solution, Apply 1-2 drops to toe BID after soaking .  Propylene Glycol 0.95 % SOLN, Place 1 drop into both eyes daily.   Reviewed prior external information including notes and imaging from  primary care provider As well as notes that were available from care everywhere and other healthcare systems.  Past medical history, social, surgical and family history all reviewed in electronic medical record.  No pertanent information unless stated regarding to the chief complaint.   Review of Systems:  No headache, visual changes, nausea, vomiting, diarrhea, constipation, dizziness, abdominal pain, skin rash, fevers, chills, night sweats, weight loss, swollen lymph nodes, body aches, joint swelling, chest pain, shortness of breath, mood changes. POSITIVE muscle aches  Objective  Blood pressure 98/72, height 5\' 9"  (1.753 m), weight 141  lb (64 kg).   General: No apparent distress alert and oriented x3 mood and affect normal, dressed appropriately.  HEENT: Pupils equal, extraocular movements intact  Respiratory: Patient's speak in full sentences and does not appear short of breath  Cardiovascular: No lower extremity edema, non tender, no erythema  Neuro: Cranial nerves II through XII are intact, neurovascularly intact in all extremities with 2+ DTRs and 2+ pulses.  Gait normal with good balance and coordination.  MSK:   Moderate to severe arthritic changes of multiple joints noted. Neck exam does have some mild loss of lordosis, some tenderness to palpation in the paraspinal musculature with limited range of motion especially with sidebending to the left.  Negative Spurling's but some crepitus noted.     Impression and Recommendations:     The above documentation has been reviewed and is  accurate and complete Lyndal Pulley, DO       Note: This dictation was prepared with Dragon dictation along with smaller phrase technology. Any transcriptional errors that result from this process are unintentional.

## 2020-01-08 NOTE — Patient Instructions (Signed)
Exercises Continue Vit D Pennsaid then use Voltaren over the counter Posture is key Heat before activity, ice after See me in 6-7 weeks

## 2020-01-09 ENCOUNTER — Encounter: Payer: Self-pay | Admitting: Family Medicine

## 2020-01-09 DIAGNOSIS — M5030 Other cervical disc degeneration, unspecified cervical region: Secondary | ICD-10-CM | POA: Insufficient documentation

## 2020-01-09 NOTE — Assessment & Plan Note (Signed)
Degenerative disc of the cervical spine. Do seen patient's daughter for manipulation but secondary to the osteoarthritic changes I would like to try conservative therapy first.  Discussed with patient my posture and anomalies, home exercises and patient work with Product/process development scientist.  Discussed icing regimen that he can be beneficial.  Discussed over-the-counter medications that I think will be helpful.  Patient wants to avoid significant amount of different prescription medications as well as avoid physical therapy at this point secondary to the coronavirus surge that is occurring.  I think this is a good plan and we will have patient follow-up again in 6 to 8 weeks to make sure patient is responding appropriately

## 2020-01-14 DIAGNOSIS — S90811A Abrasion, right foot, initial encounter: Secondary | ICD-10-CM | POA: Diagnosis not present

## 2020-01-29 DIAGNOSIS — L03115 Cellulitis of right lower limb: Secondary | ICD-10-CM | POA: Diagnosis not present

## 2020-01-29 DIAGNOSIS — R2681 Unsteadiness on feet: Secondary | ICD-10-CM | POA: Diagnosis not present

## 2020-02-09 DIAGNOSIS — R69 Illness, unspecified: Secondary | ICD-10-CM | POA: Diagnosis not present

## 2020-02-20 DIAGNOSIS — I700 Atherosclerosis of aorta: Secondary | ICD-10-CM | POA: Diagnosis not present

## 2020-02-20 DIAGNOSIS — L989 Disorder of the skin and subcutaneous tissue, unspecified: Secondary | ICD-10-CM | POA: Diagnosis not present

## 2020-02-20 DIAGNOSIS — J449 Chronic obstructive pulmonary disease, unspecified: Secondary | ICD-10-CM | POA: Diagnosis not present

## 2020-02-24 ENCOUNTER — Ambulatory Visit: Payer: Medicare HMO | Admitting: Podiatry

## 2020-02-26 ENCOUNTER — Ambulatory Visit: Payer: Medicare HMO | Admitting: Family Medicine

## 2020-03-09 DIAGNOSIS — D0471 Carcinoma in situ of skin of right lower limb, including hip: Secondary | ICD-10-CM | POA: Diagnosis not present

## 2020-03-09 DIAGNOSIS — Z85828 Personal history of other malignant neoplasm of skin: Secondary | ICD-10-CM | POA: Diagnosis not present

## 2020-03-09 DIAGNOSIS — L309 Dermatitis, unspecified: Secondary | ICD-10-CM | POA: Diagnosis not present

## 2020-03-15 DIAGNOSIS — L309 Dermatitis, unspecified: Secondary | ICD-10-CM | POA: Diagnosis not present

## 2020-03-15 DIAGNOSIS — L97511 Non-pressure chronic ulcer of other part of right foot limited to breakdown of skin: Secondary | ICD-10-CM | POA: Diagnosis not present

## 2020-03-17 ENCOUNTER — Other Ambulatory Visit: Payer: Self-pay

## 2020-03-17 ENCOUNTER — Ambulatory Visit
Admission: RE | Admit: 2020-03-17 | Discharge: 2020-03-17 | Disposition: A | Discharge status: Home / Self Care | Payer: Medicare HMO | Source: Ambulatory Visit | Attending: Internal Medicine | Admitting: Internal Medicine

## 2020-03-17 DIAGNOSIS — Z1231 Encounter for screening mammogram for malignant neoplasm of breast: Secondary | ICD-10-CM

## 2020-03-29 DIAGNOSIS — L821 Other seborrheic keratosis: Secondary | ICD-10-CM | POA: Diagnosis not present

## 2020-03-29 DIAGNOSIS — T148XXA Other injury of unspecified body region, initial encounter: Secondary | ICD-10-CM | POA: Diagnosis not present

## 2020-03-29 DIAGNOSIS — L97511 Non-pressure chronic ulcer of other part of right foot limited to breakdown of skin: Secondary | ICD-10-CM | POA: Diagnosis not present

## 2020-04-06 DIAGNOSIS — M810 Age-related osteoporosis without current pathological fracture: Secondary | ICD-10-CM | POA: Diagnosis not present

## 2020-04-06 DIAGNOSIS — E785 Hyperlipidemia, unspecified: Secondary | ICD-10-CM | POA: Diagnosis not present

## 2020-04-13 DIAGNOSIS — R82998 Other abnormal findings in urine: Secondary | ICD-10-CM | POA: Diagnosis not present

## 2020-04-13 DIAGNOSIS — R234 Changes in skin texture: Secondary | ICD-10-CM | POA: Diagnosis not present

## 2020-04-13 DIAGNOSIS — J449 Chronic obstructive pulmonary disease, unspecified: Secondary | ICD-10-CM | POA: Diagnosis not present

## 2020-04-13 DIAGNOSIS — I739 Peripheral vascular disease, unspecified: Secondary | ICD-10-CM | POA: Diagnosis not present

## 2020-04-13 DIAGNOSIS — M810 Age-related osteoporosis without current pathological fracture: Secondary | ICD-10-CM | POA: Diagnosis not present

## 2020-04-13 DIAGNOSIS — I10 Essential (primary) hypertension: Secondary | ICD-10-CM | POA: Diagnosis not present

## 2020-04-13 DIAGNOSIS — H6121 Impacted cerumen, right ear: Secondary | ICD-10-CM | POA: Diagnosis not present

## 2020-04-13 DIAGNOSIS — M542 Cervicalgia: Secondary | ICD-10-CM | POA: Diagnosis not present

## 2020-04-13 DIAGNOSIS — R2681 Unsteadiness on feet: Secondary | ICD-10-CM | POA: Diagnosis not present

## 2020-04-13 DIAGNOSIS — Z0000 Encounter for general adult medical examination without abnormal findings: Secondary | ICD-10-CM | POA: Diagnosis not present

## 2020-04-13 DIAGNOSIS — E785 Hyperlipidemia, unspecified: Secondary | ICD-10-CM | POA: Diagnosis not present

## 2020-05-17 DIAGNOSIS — L97511 Non-pressure chronic ulcer of other part of right foot limited to breakdown of skin: Secondary | ICD-10-CM | POA: Diagnosis not present

## 2020-06-10 DIAGNOSIS — L821 Other seborrheic keratosis: Secondary | ICD-10-CM | POA: Diagnosis not present

## 2020-06-10 DIAGNOSIS — Z91048 Other nonmedicinal substance allergy status: Secondary | ICD-10-CM | POA: Diagnosis not present

## 2020-06-10 DIAGNOSIS — L814 Other melanin hyperpigmentation: Secondary | ICD-10-CM | POA: Diagnosis not present

## 2020-06-10 DIAGNOSIS — L578 Other skin changes due to chronic exposure to nonionizing radiation: Secondary | ICD-10-CM | POA: Diagnosis not present

## 2020-06-10 DIAGNOSIS — L97511 Non-pressure chronic ulcer of other part of right foot limited to breakdown of skin: Secondary | ICD-10-CM | POA: Diagnosis not present

## 2020-06-10 DIAGNOSIS — L219 Seborrheic dermatitis, unspecified: Secondary | ICD-10-CM | POA: Diagnosis not present

## 2020-06-14 DIAGNOSIS — H43813 Vitreous degeneration, bilateral: Secondary | ICD-10-CM | POA: Diagnosis not present

## 2020-06-14 DIAGNOSIS — Z961 Presence of intraocular lens: Secondary | ICD-10-CM | POA: Diagnosis not present

## 2020-06-14 DIAGNOSIS — H35373 Puckering of macula, bilateral: Secondary | ICD-10-CM | POA: Diagnosis not present

## 2020-06-14 DIAGNOSIS — H52203 Unspecified astigmatism, bilateral: Secondary | ICD-10-CM | POA: Diagnosis not present

## 2020-06-17 DIAGNOSIS — J439 Emphysema, unspecified: Secondary | ICD-10-CM | POA: Diagnosis not present

## 2020-06-17 DIAGNOSIS — E785 Hyperlipidemia, unspecified: Secondary | ICD-10-CM | POA: Diagnosis not present

## 2020-06-17 DIAGNOSIS — Z79899 Other long term (current) drug therapy: Secondary | ICD-10-CM | POA: Diagnosis not present

## 2020-06-17 DIAGNOSIS — I10 Essential (primary) hypertension: Secondary | ICD-10-CM | POA: Diagnosis not present

## 2020-06-17 DIAGNOSIS — Z809 Family history of malignant neoplasm, unspecified: Secondary | ICD-10-CM | POA: Diagnosis not present

## 2020-06-17 DIAGNOSIS — R69 Illness, unspecified: Secondary | ICD-10-CM | POA: Diagnosis not present

## 2020-06-17 DIAGNOSIS — L97501 Non-pressure chronic ulcer of other part of unspecified foot limited to breakdown of skin: Secondary | ICD-10-CM | POA: Diagnosis not present

## 2020-06-17 DIAGNOSIS — I739 Peripheral vascular disease, unspecified: Secondary | ICD-10-CM | POA: Diagnosis not present

## 2020-06-17 DIAGNOSIS — M810 Age-related osteoporosis without current pathological fracture: Secondary | ICD-10-CM | POA: Diagnosis not present

## 2020-06-17 DIAGNOSIS — Z7951 Long term (current) use of inhaled steroids: Secondary | ICD-10-CM | POA: Diagnosis not present

## 2020-06-17 DIAGNOSIS — Z008 Encounter for other general examination: Secondary | ICD-10-CM | POA: Diagnosis not present

## 2020-08-09 DIAGNOSIS — Z0100 Encounter for examination of eyes and vision without abnormal findings: Secondary | ICD-10-CM | POA: Diagnosis not present

## 2020-09-17 ENCOUNTER — Ambulatory Visit: Payer: Medicare HMO | Attending: Internal Medicine

## 2020-09-17 ENCOUNTER — Other Ambulatory Visit (HOSPITAL_BASED_OUTPATIENT_CLINIC_OR_DEPARTMENT_OTHER): Payer: Self-pay

## 2020-09-17 ENCOUNTER — Other Ambulatory Visit: Payer: Self-pay

## 2020-09-17 DIAGNOSIS — Z23 Encounter for immunization: Secondary | ICD-10-CM

## 2020-09-17 MED ORDER — COVID-19 MRNA VACC (MODERNA) 100 MCG/0.5ML IM SUSP
INTRAMUSCULAR | 0 refills | Status: DC
  Filled 2020-09-17: qty 0.25, 1d supply, fill #0

## 2020-09-17 NOTE — Progress Notes (Signed)
   Covid-19 Vaccination Clinic  Name:  Christie Peters    MRN: [id number] DOB: [birth year]  09/17/2020  [name] was observed post Covid-19 immunization for 15 minutes without incident. She was provided with Vaccine Information Sheet and instruction to access the V-Safe system.   [name] was instructed to call 911 with any severe reactions post vaccine: Marland Kitchen Difficulty breathing  . Swelling of face and throat  . A fast heartbeat  . A bad rash all over body  . Dizziness and weakness   Immunizations Administered    Name Date Dose VIS Date Route   Moderna Covid-19 Booster Vaccine 09/17/2020  2:38 PM 0.25 mL 03/10/2020 Intramuscular   Manufacturer: Moderna   Lot: [id number]   [place]: [id number]

## 2020-10-12 IMAGING — CT CT HEAD W/O CM
4 series · 15 of 47 positions shown, 17 images · non-contrast
Comparison: None.

CLINICAL DATA: Fall, tripped over curb

EXAM:
CT HEAD WITHOUT CONTRAST; CT MAXILLOFACIAL WITHOUT CONTRAST; CT
CERVICAL SPINE WITHOUT CONTRAST
TECHNIQUE: Contiguous axial images were obtained from the base of the skull
through the vertex without intravenous contrast.

[Series 3: head wo · axial · 0.44mm/px · z∈[+1222,+1342]mm · 7 of 33 slices shown, 9 images]
[im 5/33  brain]
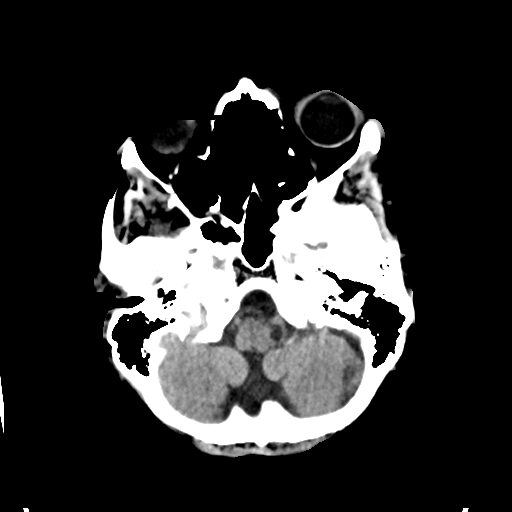
[im 5/33  bone]
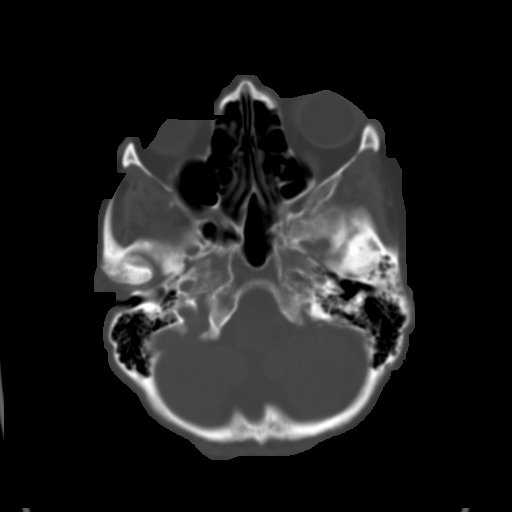
[im 9/33  brain]
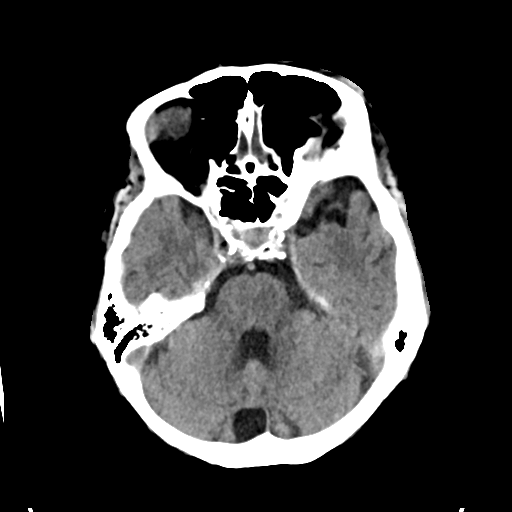
[im 13/33  brain]
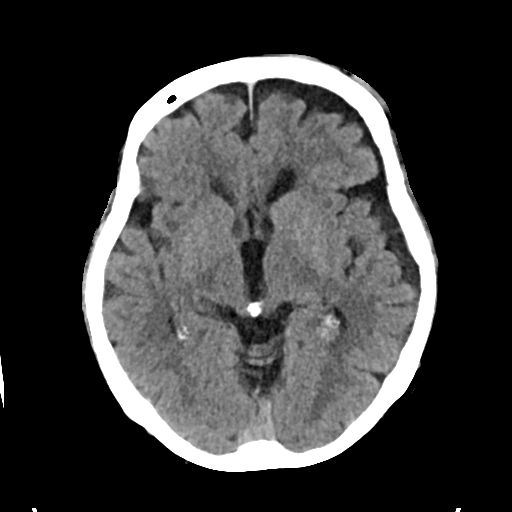
[im 17/33  brain]
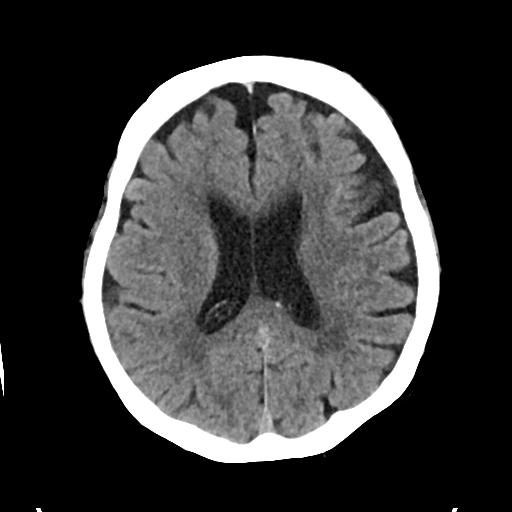
[im 21/33  brain]
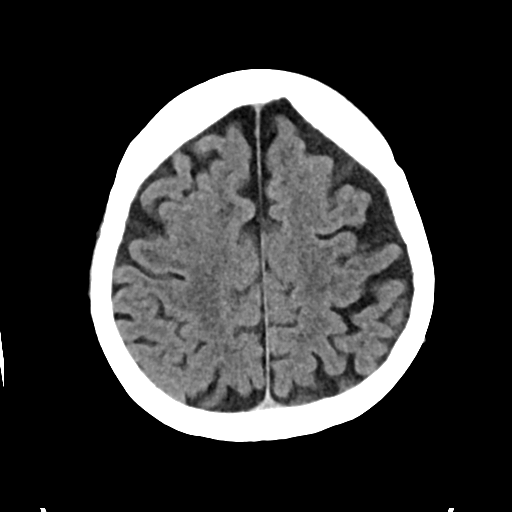
[im 21/33  bone]
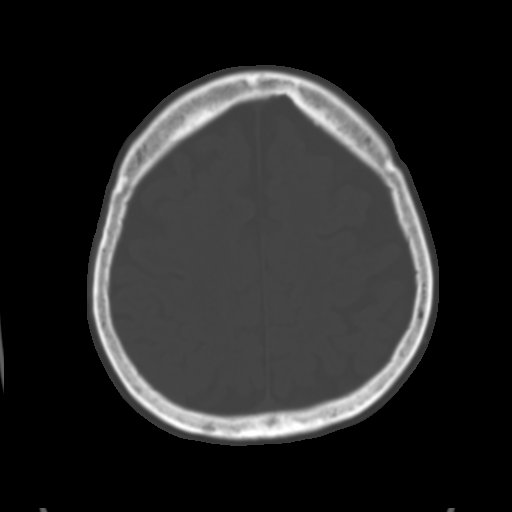
[im 25/33  brain]
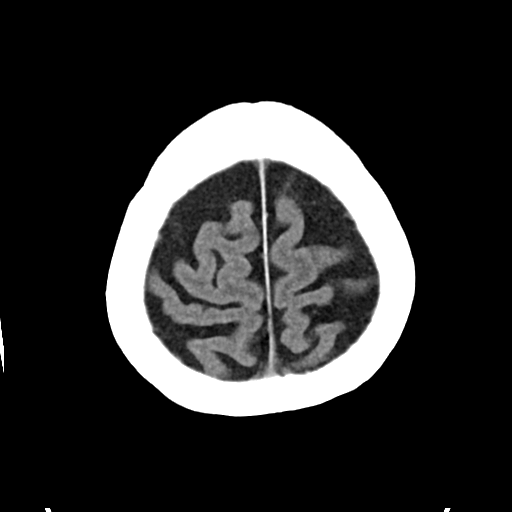
[im 29/33  brain]
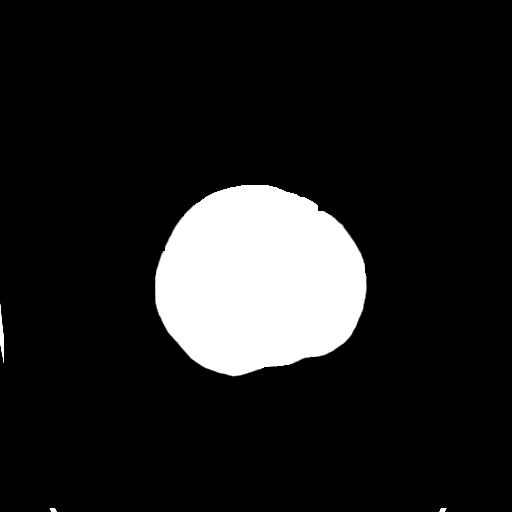

[Series 4: head bone · axial · 0.44mm/px · z∈[+1218,+1234]mm · 2 of 82 slices shown]
[im 9/82  bone]
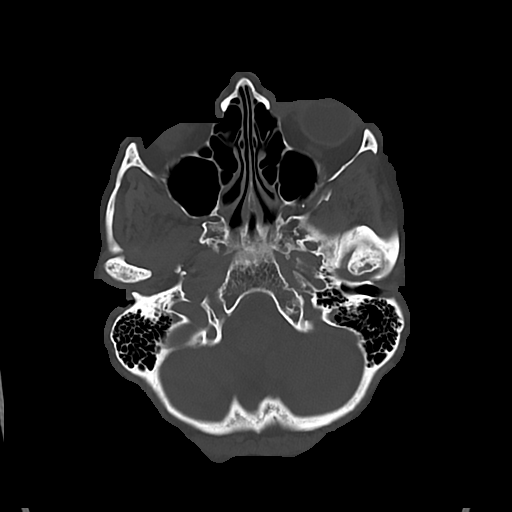
[im 17/82  bone]
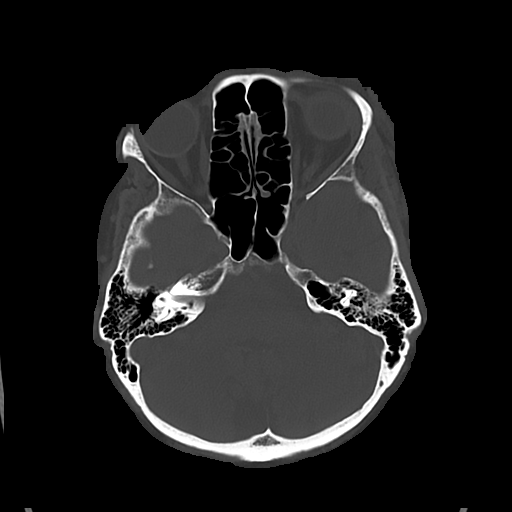

[Series 5: cor soft · coronal · 0.31mm/px · 3 of 70 slices shown]
[im 24/70  brain]
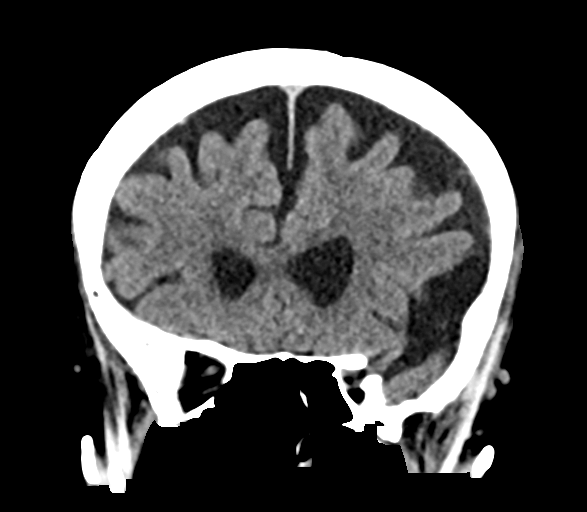
[im 31/70  brain]
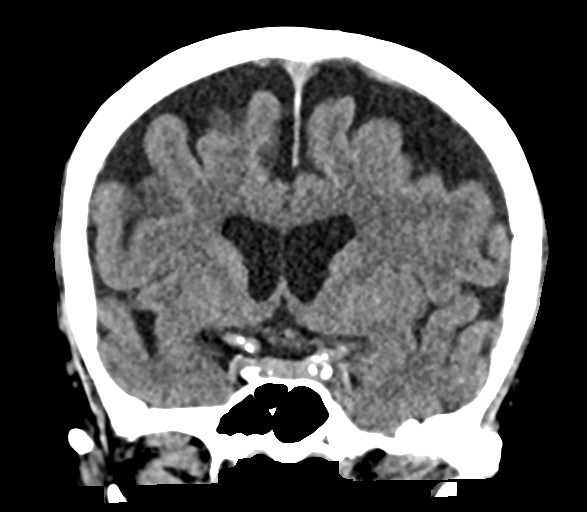
[im 39/70  brain]
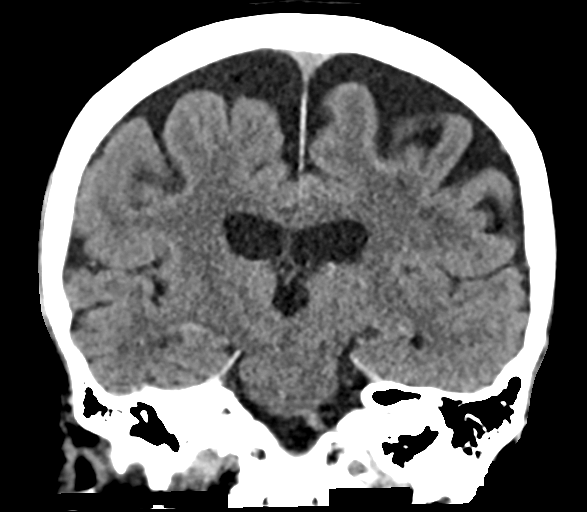

[Series 6: sag soft · sagittal · 0.38mm/px · 3 of 60 slices shown]
[im 20/60  brain]
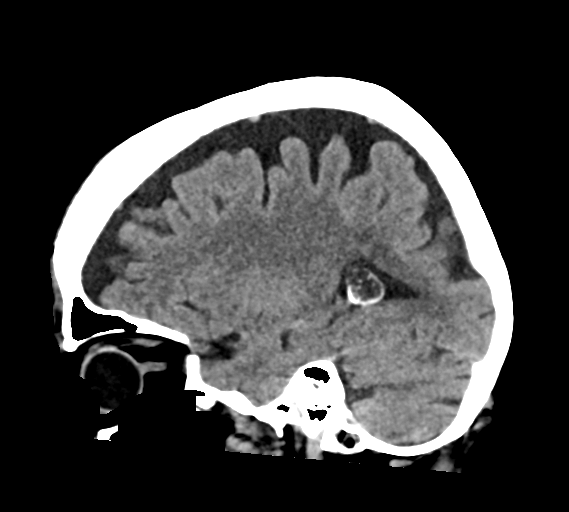
[im 30/60  brain]
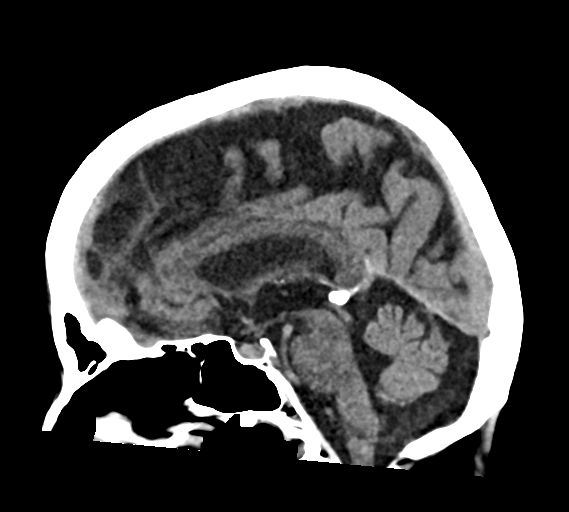
[im 40/60  brain]
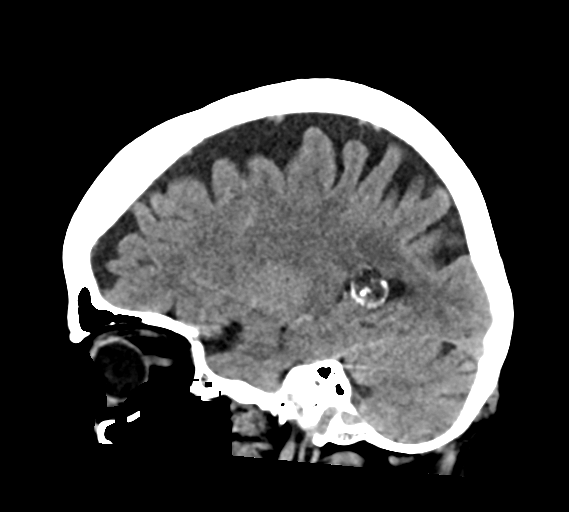

[15 of 47 positions shown; findings below may reference images not displayed]

FINDINGS: Brain: No evidence of acute territorial infarction, hemorrhage,
hydrocephalus,extra-axial collection or mass lesion/mass effect.
There is dilatation the ventricles and sulci consistent with
age-related atrophy. Low-attenuation changes in the deep white
matter consistent with small vessel ischemia.

Vascular: No hyperdense vessel or unexpected calcification.

Skull: The skull is intact. No fracture or focal lesion identified.

Sinuses/Orbits: The visualized paranasal sinuses and mastoid air
cells are clear. The orbits and globes intact.

Other: There is a small laceration over the left frontal cortex with
diffuse soft tissue swelling and a small soft tissue hematoma seen
overlying the left frontal cortex tacks and periorbital regions.

Face:

Osseous: No acute fracture or other significant osseous
abnormality.The nasal bone, mandibles, zygomatic arches and
pterygoid plates are intact.

Orbits: No fracture identified. Unremarkable appearance of globes
and orbits.

Sinuses: The visualized paranasal sinuses and mastoid air cells are
unremarkable.

Soft tissues: Small soft tissue laceration seen overlying the left
frontal cortex with diffuse soft tissue swelling and periorbital
swelling. A small amount of subcutaneous emphysema and a small soft
tissue hematoma seen.

Limited intracranial: No acute findings.

Cervical spine:

Alignment: There is a minimal retrolisthesis of C4 on C5 and C5 on
C6.

Skull base and vertebrae: Visualized skull base is intact. No
atlanto-occipital dissociation. The vertebral body heights are well
maintained. No fracture or pathologic osseous lesion seen.

Soft tissues and spinal canal: The visualized paraspinal soft
tissues are unremarkable. No prevertebral soft tissue swelling is
seen. The spinal canal is grossly unremarkable, no large epidural
collection or significant canal narrowing.

Disc levels: Multilevel degenerative changes are seen with disc
osteophyte complex and uncovertebral osteophytes most notable at
C5-C6 and C6-C7.

Upper chest: The lung apices are clear. Thoracic inlet is within
normal limits.

Other: None
IMPRESSION: No acute intracranial abnormality.

Findings consistent with age related atrophy and chronic small
vessel ischemia

Left frontal soft tissue laceration with small hematoma and
periorbital soft tissue swelling

No acute facial fracture.

No acute fracture or malalignment of the spine.

## 2020-11-03 DIAGNOSIS — H35373 Puckering of macula, bilateral: Secondary | ICD-10-CM | POA: Diagnosis not present

## 2020-11-03 DIAGNOSIS — H1789 Other corneal scars and opacities: Secondary | ICD-10-CM | POA: Diagnosis not present

## 2020-11-29 DIAGNOSIS — R262 Difficulty in walking, not elsewhere classified: Secondary | ICD-10-CM | POA: Diagnosis not present

## 2020-11-29 DIAGNOSIS — M545 Low back pain, unspecified: Secondary | ICD-10-CM | POA: Diagnosis not present

## 2020-12-07 DIAGNOSIS — M545 Low back pain, unspecified: Secondary | ICD-10-CM | POA: Diagnosis not present

## 2020-12-07 DIAGNOSIS — R262 Difficulty in walking, not elsewhere classified: Secondary | ICD-10-CM | POA: Diagnosis not present

## 2020-12-23 ENCOUNTER — Ambulatory Visit (INDEPENDENT_AMBULATORY_CARE_PROVIDER_SITE_OTHER): Payer: Medicare HMO | Admitting: Podiatry

## 2020-12-23 ENCOUNTER — Other Ambulatory Visit: Payer: Self-pay

## 2020-12-23 DIAGNOSIS — L600 Ingrowing nail: Secondary | ICD-10-CM

## 2020-12-23 MED ORDER — NEOMYCIN-POLYMYXIN-HC 1 % OT SOLN: OTIC | 1 refills | Status: DC

## 2020-12-23 NOTE — Patient Instructions (Signed)

## 2020-12-26 NOTE — Progress Notes (Signed)
She presents today chief complaint of painful ingrown toenail hallux left.  States that the pain is about a 7 out of 10 and is been going on now for about a month or so to no avail.  Nothing seems to help at she states that even hurts to wear shoes.  Objective: I have reviewed her past medical history medications allergies surgery social history review of systems.  Pulses are strong and palpable to the left foot.  Sharp incurvated nail margin along the tibia-fibula border of the hallux left.  There is no purulence no malodor.  Assessment: Ingrown nail paronychia abscess hallux left.  Plan: We had performed chemical matricectomy today after local anesthetic was administered she tolerated procedure well without complications.  I like to follow-up with her in about 1 to 2 weeks to make sure she is doing well.  She was provided both oral and written home-going instruction for care and soaking of the toe as well as a prescription for Cortisporin Otic to be applied twice daily after soaking.

## 2021-01-06 ENCOUNTER — Ambulatory Visit: Payer: Medicare HMO | Admitting: Podiatry

## 2021-01-18 ENCOUNTER — Other Ambulatory Visit: Payer: Self-pay

## 2021-01-18 ENCOUNTER — Encounter: Payer: Self-pay | Admitting: Podiatry

## 2021-01-18 ENCOUNTER — Ambulatory Visit: Payer: Medicare HMO | Admitting: Podiatry

## 2021-01-18 DIAGNOSIS — L600 Ingrowing nail: Secondary | ICD-10-CM

## 2021-01-18 DIAGNOSIS — Z9889 Other specified postprocedural states: Secondary | ICD-10-CM

## 2021-01-18 NOTE — Progress Notes (Signed)
She presents today for follow-up of her paronychia hallux left states is doing much better..  Objective: Vital signs are stable alert oriented x3 there is scab present to the tibial border the hallux left there is no erythema cellulitis drainage or odor no pain on palpation.  Assessment: Well-healing surgical toe.  Plan: Follow-up with her on an as-needed basis.

## 2021-02-03 DIAGNOSIS — R262 Difficulty in walking, not elsewhere classified: Secondary | ICD-10-CM | POA: Diagnosis not present

## 2021-02-03 DIAGNOSIS — M545 Low back pain, unspecified: Secondary | ICD-10-CM | POA: Diagnosis not present

## 2021-03-02 DIAGNOSIS — M545 Low back pain, unspecified: Secondary | ICD-10-CM | POA: Diagnosis not present

## 2021-03-02 DIAGNOSIS — R262 Difficulty in walking, not elsewhere classified: Secondary | ICD-10-CM | POA: Diagnosis not present

## 2021-05-25 DIAGNOSIS — M8580 Other specified disorders of bone density and structure, unspecified site: Secondary | ICD-10-CM | POA: Diagnosis not present

## 2021-05-25 DIAGNOSIS — E785 Hyperlipidemia, unspecified: Secondary | ICD-10-CM | POA: Diagnosis not present

## 2021-05-30 DIAGNOSIS — R82998 Other abnormal findings in urine: Secondary | ICD-10-CM | POA: Diagnosis not present

## 2021-05-30 DIAGNOSIS — Z1331 Encounter for screening for depression: Secondary | ICD-10-CM | POA: Diagnosis not present

## 2021-05-30 DIAGNOSIS — E785 Hyperlipidemia, unspecified: Secondary | ICD-10-CM | POA: Diagnosis not present

## 2021-05-30 DIAGNOSIS — Z0000 Encounter for general adult medical examination without abnormal findings: Secondary | ICD-10-CM | POA: Diagnosis not present

## 2021-05-30 DIAGNOSIS — I10 Essential (primary) hypertension: Secondary | ICD-10-CM | POA: Diagnosis not present

## 2021-05-30 DIAGNOSIS — J449 Chronic obstructive pulmonary disease, unspecified: Secondary | ICD-10-CM | POA: Diagnosis not present

## 2021-05-30 DIAGNOSIS — I7121 Aneurysm of the ascending aorta, without rupture: Secondary | ICD-10-CM | POA: Diagnosis not present

## 2021-05-30 DIAGNOSIS — M810 Age-related osteoporosis without current pathological fracture: Secondary | ICD-10-CM | POA: Diagnosis not present

## 2021-05-30 DIAGNOSIS — I700 Atherosclerosis of aorta: Secondary | ICD-10-CM | POA: Diagnosis not present

## 2021-05-30 DIAGNOSIS — I739 Peripheral vascular disease, unspecified: Secondary | ICD-10-CM | POA: Diagnosis not present

## 2021-05-30 DIAGNOSIS — M542 Cervicalgia: Secondary | ICD-10-CM | POA: Diagnosis not present

## 2021-05-30 DIAGNOSIS — Z1339 Encounter for screening examination for other mental health and behavioral disorders: Secondary | ICD-10-CM | POA: Diagnosis not present

## 2021-06-07 ENCOUNTER — Other Ambulatory Visit: Payer: Self-pay | Admitting: Internal Medicine

## 2021-06-07 DIAGNOSIS — I7121 Aneurysm of the ascending aorta, without rupture: Secondary | ICD-10-CM

## 2021-06-09 ENCOUNTER — Ambulatory Visit
Admission: RE | Admit: 2021-06-09 | Discharge: 2021-06-09 | Disposition: A | Discharge status: Home / Self Care | Payer: Medicare HMO | Source: Ambulatory Visit | Attending: Internal Medicine | Admitting: Internal Medicine

## 2021-06-09 ENCOUNTER — Other Ambulatory Visit: Payer: Self-pay

## 2021-06-09 ENCOUNTER — Inpatient Hospital Stay: Admission: RE | Admit: 2021-06-09 | Payer: Medicare HMO | Source: Ambulatory Visit

## 2021-06-09 DIAGNOSIS — I7121 Aneurysm of the ascending aorta, without rupture: Secondary | ICD-10-CM

## 2021-06-09 DIAGNOSIS — I700 Atherosclerosis of aorta: Secondary | ICD-10-CM | POA: Diagnosis not present

## 2021-06-09 DIAGNOSIS — I712 Thoracic aortic aneurysm, without rupture, unspecified: Secondary | ICD-10-CM | POA: Diagnosis not present

## 2021-06-09 MED ORDER — IOPAMIDOL (ISOVUE-300) INJECTION 61%
75.0000 mL | Freq: Once | INTRAVENOUS | Status: AC | PRN
  Administered 2021-06-09: 75 mL via INTRAVENOUS

## 2021-06-15 ENCOUNTER — Other Ambulatory Visit: Payer: Self-pay | Admitting: Internal Medicine

## 2021-06-15 DIAGNOSIS — I712 Thoracic aortic aneurysm, without rupture, unspecified: Secondary | ICD-10-CM

## 2021-06-23 DIAGNOSIS — Z23 Encounter for immunization: Secondary | ICD-10-CM | POA: Diagnosis not present

## 2021-06-23 DIAGNOSIS — M810 Age-related osteoporosis without current pathological fracture: Secondary | ICD-10-CM | POA: Diagnosis not present

## 2021-06-30 DIAGNOSIS — R051 Acute cough: Secondary | ICD-10-CM | POA: Diagnosis not present

## 2021-06-30 DIAGNOSIS — J449 Chronic obstructive pulmonary disease, unspecified: Secondary | ICD-10-CM | POA: Diagnosis not present

## 2021-06-30 DIAGNOSIS — J441 Chronic obstructive pulmonary disease with (acute) exacerbation: Secondary | ICD-10-CM | POA: Diagnosis not present

## 2021-06-30 DIAGNOSIS — I10 Essential (primary) hypertension: Secondary | ICD-10-CM | POA: Diagnosis not present

## 2021-09-22 ENCOUNTER — Ambulatory Visit (HOSPITAL_BASED_OUTPATIENT_CLINIC_OR_DEPARTMENT_OTHER)
Admission: RE | Admit: 2021-09-22 | Discharge: 2021-09-22 | Disposition: A | Discharge status: Home / Self Care | Payer: Medicare HMO | Source: Ambulatory Visit | Attending: Family Medicine | Admitting: Family Medicine

## 2021-09-22 ENCOUNTER — Other Ambulatory Visit (HOSPITAL_BASED_OUTPATIENT_CLINIC_OR_DEPARTMENT_OTHER): Payer: Self-pay | Admitting: Family Medicine

## 2021-09-22 DIAGNOSIS — M7989 Other specified soft tissue disorders: Secondary | ICD-10-CM | POA: Diagnosis not present

## 2021-09-22 DIAGNOSIS — M545 Low back pain, unspecified: Secondary | ICD-10-CM | POA: Diagnosis not present

## 2021-09-22 DIAGNOSIS — S32020A Wedge compression fracture of second lumbar vertebra, initial encounter for closed fracture: Secondary | ICD-10-CM | POA: Diagnosis not present

## 2021-09-22 DIAGNOSIS — M79671 Pain in right foot: Secondary | ICD-10-CM | POA: Diagnosis not present

## 2021-09-22 DIAGNOSIS — M810 Age-related osteoporosis without current pathological fracture: Secondary | ICD-10-CM | POA: Diagnosis not present

## 2021-09-22 DIAGNOSIS — M8588 Other specified disorders of bone density and structure, other site: Secondary | ICD-10-CM | POA: Diagnosis not present

## 2021-09-22 DIAGNOSIS — K5900 Constipation, unspecified: Secondary | ICD-10-CM | POA: Diagnosis not present

## 2021-09-22 DIAGNOSIS — W010XXA Fall on same level from slipping, tripping and stumbling without subsequent striking against object, initial encounter: Secondary | ICD-10-CM | POA: Diagnosis not present

## 2021-09-22 DIAGNOSIS — S92351A Displaced fracture of fifth metatarsal bone, right foot, initial encounter for closed fracture: Secondary | ICD-10-CM | POA: Diagnosis not present

## 2021-09-22 DIAGNOSIS — R2681 Unsteadiness on feet: Secondary | ICD-10-CM | POA: Diagnosis not present

## 2021-09-23 DIAGNOSIS — M79671 Pain in right foot: Secondary | ICD-10-CM | POA: Diagnosis not present

## 2021-10-01 DIAGNOSIS — R55 Syncope and collapse: Secondary | ICD-10-CM | POA: Diagnosis not present

## 2021-10-01 DIAGNOSIS — R4020 Unspecified coma: Secondary | ICD-10-CM | POA: Diagnosis not present

## 2021-10-01 DIAGNOSIS — R0689 Other abnormalities of breathing: Secondary | ICD-10-CM | POA: Diagnosis not present

## 2021-10-01 DIAGNOSIS — I959 Hypotension, unspecified: Secondary | ICD-10-CM | POA: Diagnosis not present

## 2021-10-01 DIAGNOSIS — R0902 Hypoxemia: Secondary | ICD-10-CM | POA: Diagnosis not present

## 2021-10-03 DIAGNOSIS — M79671 Pain in right foot: Secondary | ICD-10-CM | POA: Insufficient documentation

## 2021-10-04 ENCOUNTER — Other Ambulatory Visit: Payer: Self-pay | Admitting: Family Medicine

## 2021-10-04 ENCOUNTER — Ambulatory Visit
Admission: RE | Admit: 2021-10-04 | Discharge: 2021-10-04 | Disposition: A | Discharge status: Home / Self Care | Payer: Medicare HMO | Source: Ambulatory Visit | Attending: Family Medicine | Admitting: Family Medicine

## 2021-10-04 ENCOUNTER — Other Ambulatory Visit (HOSPITAL_COMMUNITY): Payer: Self-pay | Admitting: Family Medicine

## 2021-10-04 DIAGNOSIS — R2681 Unsteadiness on feet: Secondary | ICD-10-CM | POA: Diagnosis not present

## 2021-10-04 DIAGNOSIS — H5702 Anisocoria: Secondary | ICD-10-CM | POA: Diagnosis not present

## 2021-10-04 DIAGNOSIS — R42 Dizziness and giddiness: Secondary | ICD-10-CM | POA: Diagnosis not present

## 2021-10-04 DIAGNOSIS — I10 Essential (primary) hypertension: Secondary | ICD-10-CM | POA: Diagnosis not present

## 2021-10-04 DIAGNOSIS — R55 Syncope and collapse: Secondary | ICD-10-CM | POA: Diagnosis not present

## 2021-10-04 DIAGNOSIS — S32020A Wedge compression fracture of second lumbar vertebra, initial encounter for closed fracture: Secondary | ICD-10-CM | POA: Diagnosis not present

## 2021-10-04 DIAGNOSIS — S92351A Displaced fracture of fifth metatarsal bone, right foot, initial encounter for closed fracture: Secondary | ICD-10-CM | POA: Diagnosis not present

## 2021-10-04 DIAGNOSIS — E871 Hypo-osmolality and hyponatremia: Secondary | ICD-10-CM | POA: Diagnosis not present

## 2021-10-04 DIAGNOSIS — K5900 Constipation, unspecified: Secondary | ICD-10-CM | POA: Diagnosis not present

## 2021-10-04 DIAGNOSIS — W010XXA Fall on same level from slipping, tripping and stumbling without subsequent striking against object, initial encounter: Secondary | ICD-10-CM | POA: Diagnosis not present

## 2021-10-06 ENCOUNTER — Ambulatory Visit (HOSPITAL_COMMUNITY)
Admission: RE | Admit: 2021-10-06 | Discharge: 2021-10-06 | Disposition: A | Discharge status: Home / Self Care | Payer: Medicare HMO | Source: Ambulatory Visit | Attending: Family Medicine | Admitting: Family Medicine

## 2021-10-06 DIAGNOSIS — R55 Syncope and collapse: Secondary | ICD-10-CM | POA: Diagnosis not present

## 2021-10-11 DIAGNOSIS — M545 Low back pain, unspecified: Secondary | ICD-10-CM | POA: Insufficient documentation

## 2021-10-11 DIAGNOSIS — M79671 Pain in right foot: Secondary | ICD-10-CM | POA: Diagnosis not present

## 2021-10-11 DIAGNOSIS — S92354A Nondisplaced fracture of fifth metatarsal bone, right foot, initial encounter for closed fracture: Secondary | ICD-10-CM | POA: Diagnosis not present

## 2021-10-24 ENCOUNTER — Encounter: Payer: Self-pay | Admitting: Cardiovascular Disease

## 2021-10-24 ENCOUNTER — Ambulatory Visit: Payer: Medicare HMO | Admitting: Cardiovascular Disease

## 2021-10-24 ENCOUNTER — Ambulatory Visit (INDEPENDENT_AMBULATORY_CARE_PROVIDER_SITE_OTHER): Payer: Medicare HMO

## 2021-10-24 VITALS — BP 120/70 | HR 81 | Ht 69.0 in | Wt 135.0 lb

## 2021-10-24 DIAGNOSIS — R55 Syncope and collapse: Secondary | ICD-10-CM | POA: Diagnosis not present

## 2021-10-24 NOTE — Progress Notes (Signed)
Chief Complaint  Patient presents with   New Patient (Initial Visit)    Syncope   History of Present Illness: 86 yo female with history of HTN, COPD and hyperlipidemia who is here today as a new patient for the evaluation of syncope. She thinks she passed out in the bathroom in April 2023. She fractured a bone in her foot. She tells me that she was having a pedicure several weeks ago and began to feel dizzy. She then passed out. EMS was called but she returned to baseline so she was not taken to the ED. She had decreased po intake leading up to that secondary to constipation. She was seen in primary care 10/04/21. EKG with sinus rhythm. BP was 112/70. Brain MRI 10/06/21 with no acute abnormality. There was chronic small vessel ischemia and cerebral volume loss. She has had no recurrence of syncope. She denies any chest pain or dyspnea. She has COPD and is on nocturnal supplemental O2 therapy. She has had no exertional chest pain. No palpitations.   Primary Care Physician: Donnajean Lopes, MD   Past Medical History:  Diagnosis Date   Dental crowns present    Dupuytren's contracture of left hand 09/2014   left middle finger   Hyperlipidemia    Hypertension    states under control with meds., has been on med. x 2 yr.    Past Surgical History:  Procedure Laterality Date   BLEPHAROPLASTY Bilateral 02/17/2013   upper lid   CATARACT EXTRACTION W/ INTRAOCULAR LENS  IMPLANT, BILATERAL     CESAREAN SECTION     COLONOSCOPY WITH PROPOFOL  03/21/2013   FASCIOTOMY Left 10/01/2014   Procedure: FASCIOTOMY LEFT MIDDLE FINGER, FASCIOTOMY LEFT RING FINGER,  FASCIOTOMY LEFT SMALL FINGER ;  Surgeon: [name], MD;  Location: [facility];  Service: Orthopedics;  Laterality: Left;    Current Outpatient Medications  Medication Sig Dispense Refill   benazepril (LOTENSIN) 20 MG tablet      Calcium Carbonate-Vitamin D (CALTRATE 600+D PO) Take by mouth.     COVID-19 mRNA vaccine, Moderna,  100 MCG/0.5ML injection Inject into the muscle. 0.25 mL 0   Fluticasone-Umeclidin-Vilant (TRELEGY ELLIPTA IN) Inhale 1 puff into the lungs daily.     hydrochlorothiazide (HYDRODIURIL) 25 MG tablet TAKE ONE TABLET BY MOUTH EVERY DAY 30 tablet 0   pravastatin (PRAVACHOL) 10 MG tablet Take 10 mg by mouth at bedtime.     Propylene Glycol 0.95 % SOLN Place 1 drop into both eyes daily.     No current facility-administered medications for this visit.    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  Tobacco Use   Smoking status: Former    Packs/day: 0.50    Years: 60.00    Pack years: 30.00    Types: Cigarettes    Quit date: 09/18/2013    Years since quitting: 8.1   Smokeless tobacco: Never   Tobacco comments:    pt smoked off and on for about 60 years, reports stopping several times and then starting back  Vaping Use   Vaping Use: Never used  Substance and Sexual Activity  Alcohol use: Yes    Comment: occasionally   Drug use: No   Sexual activity: Not on file  Other Topics Concern   Not on file  Social History Narrative   4 children, one is decreased   Original from [place]   Exercise- no   Diet- she is careful w/ diet   Will spend 2 months in [place] this winter    Social Determinants of Health   Financial Resource Strain: Not on file  Food Insecurity: Not on file  Transportation Needs: Not on file  Physical Activity: Not on file  Stress: Not on file  Social Connections: Not on file  Intimate Partner Violence: Not on file    Family History  Problem Relation Age of Onset   Stroke Mother    Hypertension Father    Breast cancer Sister    Birth defects Sister     Diabetes Brother    Heart disease Brother        CABG    Review of Systems:  As stated in the HPI and otherwise negative.   BP 120/70   Pulse 81   Ht '5\' 9"'$  (1.753 m)   Wt 135 lb (61.2 kg)   SpO2 98%   BMI 19.94 kg/m   Physical Examination: General: Well developed, well nourished, NAD  HEENT: OP clear, mucus membranes moist  SKIN: warm, dry. No rashes. Neuro: No focal deficits  Musculoskeletal: Muscle strength 5/5 all ext  Psychiatric: Mood and affect normal  Neck: No JVD, no carotid bruits, no thyromegaly, no lymphadenopathy.  Lungs:Clear bilaterally, no wheezes, rhonci, crackles Cardiovascular: Regular rate and rhythm. No murmurs, gallops or rubs. Abdomen:Soft. Bowel sounds present. Non-tender.  Extremities: No lower extremity edema. Pulses are 2 + in the bilateral DP/PT.  EKG:  EKG is ordered today. The ekg ordered today demonstrates NSR  Recent Labs: No results found for requested labs within last 8760 hours.   Lipid Panel    Component Value Date/Time   CHOL 244 (H) 10/18/2010 0921   TRIG 50.0 10/18/2010 0921   TRIG 59 04/16/2006 1107   HDL 100.20 10/18/2010 0921   CHOLHDL 2 10/18/2010 0921   VLDL 10.0 10/18/2010 0921   LDLCALC 172 (H) 09/12/2007 0841   LDLDIRECT 152.2 10/18/2010 0921     Wt Readings from Last 3 Encounters:  10/24/21 135 lb (61.2 kg)  01/08/20 141 lb (64 kg)  09/03/19 143 lb 9.6 oz (65.1 kg)      Assessment and Plan:   1. Syncope: Unclear etiology. EKG is normal today. BP is ok. Will arrange an echo to assess LVEF and exclude structural heart disease. Carotid artery dopplers to exclude carotid disease. 30 day cardiac monitor. Driving restrictions for the next six months.   Labs/ tests ordered today include:   Orders Placed This Encounter  Procedures   Cardiac event monitor   EKG 12-Lead   ECHOCARDIOGRAM COMPLETE   VAS US CAROTID   Disposition:   F/U with me in 4 weeks.   Signed, Lauree Chandler, MD 10/24/2021 4:56 PM     [address], [place], [place]  [address] Phone: [phone]; Fax: [phone]

## 2021-10-24 NOTE — Patient Instructions (Signed)
Medication Instructions:  Your physician recommends that you continue on your current medications as directed. Please refer to the Current Medication list given to you today.  *If you need a refill on your cardiac medications before your next appointment, please call your pharmacy*   Lab Work: None   Testing/Procedures: Your physician has requested that you have an echocardiogram. Echocardiography is a painless test that uses sound waves to create images of your heart. It provides your doctor with information about the size and shape of your heart and how well your heart's chambers and valves are working. This procedure takes approximately one hour. There are no restrictions for this procedure.   Your physician has requested that you have a carotid duplex. This test is an ultrasound of the carotid arteries in your neck. It looks at blood flow through these arteries that supply the brain with blood. Allow one hour for this exam. There are no restrictions or special instructions.   Your physician has recommended that you wear an event monitor. Event monitors are medical devices that record the heart's electrical activity. Doctors most often [place] these monitors to diagnose arrhythmias. Arrhythmias are problems with the speed or rhythm of the heartbeat. The monitor is a small, portable device. You can wear one while you do your normal daily activities. This is usually used to diagnose what is causing palpitations/syncope (passing out).    Follow-Up: At [facility], you and your health needs are our priority.  As part of our continuing mission to provide you with exceptional heart care, we have created designated Provider Care Teams.  These Care Teams include your primary Cardiologist (physician) and Advanced Practice Providers (APPs -  Physician Assistants and Nurse Practitioners) who all work together to provide you with the care you need, when you need it.   Your next appointment:   8  week(s)  The format for your next appointment:   In Person  Provider:   Lauree Chandler, MD  :1}   Important Information About Sugar

## 2021-10-24 NOTE — Progress Notes (Unsigned)
[id number] [employer] event monitor from office inventory applied to patient.

## 2021-11-01 DIAGNOSIS — M5136 Other intervertebral disc degeneration, lumbar region: Secondary | ICD-10-CM | POA: Diagnosis not present

## 2021-11-01 DIAGNOSIS — M5451 Vertebrogenic low back pain: Secondary | ICD-10-CM | POA: Diagnosis not present

## 2021-11-07 DIAGNOSIS — H52203 Unspecified astigmatism, bilateral: Secondary | ICD-10-CM | POA: Diagnosis not present

## 2021-11-07 DIAGNOSIS — H1789 Other corneal scars and opacities: Secondary | ICD-10-CM | POA: Diagnosis not present

## 2021-11-07 DIAGNOSIS — H35373 Puckering of macula, bilateral: Secondary | ICD-10-CM | POA: Diagnosis not present

## 2021-11-08 DIAGNOSIS — S92354A Nondisplaced fracture of fifth metatarsal bone, right foot, initial encounter for closed fracture: Secondary | ICD-10-CM | POA: Diagnosis not present

## 2021-11-09 ENCOUNTER — Ambulatory Visit (HOSPITAL_COMMUNITY)
Admission: RE | Admit: 2021-11-09 | Discharge: 2021-11-09 | Disposition: A | Discharge status: Home / Self Care | Payer: Medicare HMO | Source: Ambulatory Visit | Attending: Cardiovascular Disease | Admitting: Cardiovascular Disease

## 2021-11-09 ENCOUNTER — Ambulatory Visit (HOSPITAL_BASED_OUTPATIENT_CLINIC_OR_DEPARTMENT_OTHER): Payer: Medicare HMO

## 2021-11-09 DIAGNOSIS — R55 Syncope and collapse: Secondary | ICD-10-CM | POA: Diagnosis not present

## 2021-11-09 LAB — ECHOCARDIOGRAM COMPLETE
Area-P 1/2: 3.51 cm2
P 1/2 time: 547 msec
S' Lateral: 2.1 cm

## 2021-11-11 ENCOUNTER — Telehealth: Payer: Self-pay | Admitting: Cardiovascular Disease

## 2021-11-11 NOTE — Telephone Encounter (Signed)
Patients daughter walked into the office. I gave her 2 extra patches and printed off our Dot phrase instructions for her. The daughter will call [employer] and get a new box shipped to her to mail monitor back after patients 30 days is up.

## 2021-11-15 ENCOUNTER — Other Ambulatory Visit (HOSPITAL_COMMUNITY): Payer: Medicare HMO

## 2021-11-16 ENCOUNTER — Other Ambulatory Visit: Payer: Self-pay | Admitting: *Deleted

## 2021-11-16 DIAGNOSIS — M25569 Pain in unspecified knee: Secondary | ICD-10-CM

## 2021-11-18 ENCOUNTER — Encounter (HOSPITAL_COMMUNITY): Payer: Medicare HMO

## 2021-11-28 DIAGNOSIS — J449 Chronic obstructive pulmonary disease, unspecified: Secondary | ICD-10-CM | POA: Diagnosis not present

## 2021-11-28 DIAGNOSIS — R2681 Unsteadiness on feet: Secondary | ICD-10-CM | POA: Diagnosis not present

## 2021-11-28 DIAGNOSIS — E871 Hypo-osmolality and hyponatremia: Secondary | ICD-10-CM | POA: Diagnosis not present

## 2021-11-28 DIAGNOSIS — I739 Peripheral vascular disease, unspecified: Secondary | ICD-10-CM | POA: Diagnosis not present

## 2021-11-28 DIAGNOSIS — I700 Atherosclerosis of aorta: Secondary | ICD-10-CM | POA: Diagnosis not present

## 2021-11-28 DIAGNOSIS — I10 Essential (primary) hypertension: Secondary | ICD-10-CM | POA: Diagnosis not present

## 2021-11-28 DIAGNOSIS — L989 Disorder of the skin and subcutaneous tissue, unspecified: Secondary | ICD-10-CM | POA: Diagnosis not present

## 2021-11-28 DIAGNOSIS — I7121 Aneurysm of the ascending aorta, without rupture: Secondary | ICD-10-CM | POA: Diagnosis not present

## 2021-12-07 DIAGNOSIS — H903 Sensorineural hearing loss, bilateral: Secondary | ICD-10-CM | POA: Diagnosis not present

## 2021-12-08 ENCOUNTER — Other Ambulatory Visit: Payer: Self-pay

## 2021-12-08 DIAGNOSIS — M810 Age-related osteoporosis without current pathological fracture: Secondary | ICD-10-CM

## 2021-12-15 ENCOUNTER — Telehealth: Payer: Self-pay | Admitting: Pharmacy Technician

## 2021-12-15 NOTE — Telephone Encounter (Signed)
Auth Submission: APPROVED Payer: [employer] Medication & CPT/J Code(s) submitted: Prolia (Denosumab) [phone] Route of submission (phone, fax, portal): PHONE: [phone] FAX: [phone] Auth type: Buy/Bill Units/visits requested: X2 DOSES Reference number: [facility] Approval from: 12/13/21 to 12/14/22

## 2021-12-19 ENCOUNTER — Ambulatory Visit (HOSPITAL_COMMUNITY)
Admission: RE | Admit: 2021-12-19 | Discharge: 2021-12-19 | Disposition: A | Discharge status: Home / Self Care | Payer: Medicare HMO | Source: Ambulatory Visit | Attending: Vascular Surgery | Admitting: Vascular Surgery

## 2021-12-19 ENCOUNTER — Ambulatory Visit: Payer: Medicare HMO

## 2021-12-19 DIAGNOSIS — M25569 Pain in unspecified knee: Secondary | ICD-10-CM | POA: Diagnosis not present

## 2021-12-22 DIAGNOSIS — M810 Age-related osteoporosis without current pathological fracture: Secondary | ICD-10-CM | POA: Diagnosis not present

## 2021-12-26 ENCOUNTER — Ambulatory Visit: Payer: Medicare HMO | Admitting: Cardiovascular Disease

## 2021-12-26 ENCOUNTER — Ambulatory Visit (HOSPITAL_BASED_OUTPATIENT_CLINIC_OR_DEPARTMENT_OTHER): Payer: Medicare HMO

## 2021-12-26 ENCOUNTER — Encounter: Payer: Self-pay | Admitting: Cardiovascular Disease

## 2021-12-26 VITALS — BP 122/70 | HR 96 | Ht 69.0 in | Wt 132.0 lb

## 2021-12-26 DIAGNOSIS — I712 Thoracic aortic aneurysm, without rupture, unspecified: Secondary | ICD-10-CM

## 2021-12-26 DIAGNOSIS — R55 Syncope and collapse: Secondary | ICD-10-CM

## 2021-12-26 DIAGNOSIS — I351 Nonrheumatic aortic (valve) insufficiency: Secondary | ICD-10-CM | POA: Diagnosis not present

## 2021-12-26 DIAGNOSIS — Z01812 Encounter for preprocedural laboratory examination: Secondary | ICD-10-CM | POA: Diagnosis not present

## 2021-12-26 LAB — BASIC METABOLIC PANEL
BUN/Creatinine Ratio: 13 (ref 12–28)
BUN: 8 mg/dL (ref 8–27)
CO2: 27 mmol/L (ref 20–29)
Calcium: 9.8 mg/dL (ref 8.7–10.3)
Chloride: 91 mmol/L — ABNORMAL LOW (ref 96–106)
Creatinine, Ser: 0.62 mg/dL (ref 0.57–1.00)
Glucose: 94 mg/dL (ref 70–99)
Potassium: 4.2 mmol/L (ref 3.5–5.2)
Sodium: 130 mmol/L — ABNORMAL LOW (ref 134–144)
eGFR: 88 mL/min/{1.73_m2} (ref >59)

## 2021-12-26 NOTE — Progress Notes (Signed)
Chief Complaint  Patient presents with   Follow-up    Syncope   History of Present Illness: 86 yo female with history of HTN, COPD and hyperlipidemia who is here today for cardiac follow up. I saw her as a new patient for the evaluation of syncope on 10/24/21. She thinks she passed out in the bathroom in April 2023. She fractured a bone in her foot. She told me that she was having a pedicure in May 2023 and began to feel dizzy. She then passed out. EMS was called but she returned to baseline so she was not taken to the ED. She had decreased po intake leading up to that secondary to constipation. She was seen in primary care 10/04/21. EKG with sinus rhythm. BP was 112/70. Brain MRI 10/06/21 with no acute abnormality. There was chronic small vessel ischemia and cerebral volume loss. She denied any chest pain or dyspnea. She has COPD and is on nocturnal supplemental O2 therapy. Cardiac monitor June 2023 with PACs, PVCs and brief NSVT. NO AV block or pauses/bradycardia noted. Echo 11/09/21 with LVEF=60-65%, moderate basal septal hypertrophy, trivial MR. Aortic valve sclerosis without stenosis, mild AI. Mild dilation of the aortic root. Carotid artery dopplers 11/09/21 with no significant disease.   She is here today for follow up. The patient denies any chest pain, dyspnea, palpitations, lower extremity edema, orthopnea, PND, dizziness, near syncope or syncope.   Primary Care Physician: Donnajean Lopes, MD   Past Medical History:  Diagnosis Date   Dental crowns present    Dupuytren's contracture of left hand 09/2014   left middle finger   Hyperlipidemia    Hypertension    states under control with meds., has been on med. x 2 yr.    Past Surgical History:  Procedure Laterality Date   BLEPHAROPLASTY Bilateral 02/17/2013   upper lid   CATARACT EXTRACTION W/ INTRAOCULAR LENS  IMPLANT, BILATERAL     CESAREAN SECTION     COLONOSCOPY WITH PROPOFOL  03/21/2013   FASCIOTOMY Left 10/01/2014    Procedure: FASCIOTOMY LEFT MIDDLE FINGER, FASCIOTOMY LEFT RING FINGER,  FASCIOTOMY LEFT SMALL FINGER ;  Surgeon: [name], MD;  Location: [facility];  Service: Orthopedics;  Laterality: Left;    Current Outpatient Medications  Medication Sig Dispense Refill   benazepril (LOTENSIN) 20 MG tablet Take 20 mg by mouth daily.     Calcium Carbonate-Vitamin D (CALTRATE 600+D PO) Take by mouth.     COVID-19 mRNA vaccine, Moderna, 100 MCG/0.5ML injection Inject into the muscle. 0.25 mL 0   Fluticasone-Umeclidin-Vilant (TRELEGY ELLIPTA IN) Inhale 1 puff into the lungs daily.     hydrochlorothiazide (HYDRODIURIL) 25 MG tablet TAKE ONE TABLET BY MOUTH EVERY DAY 30 tablet 0   pravastatin (PRAVACHOL) 10 MG tablet Take 10 mg by mouth at bedtime.     Propylene Glycol 0.95 % SOLN Place 1 drop into both eyes daily.     No current facility-administered medications for this visit.    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  Tobacco Use   Smoking status: Former    Packs/day: 0.50    Years:  60.00    Total pack years: 30.00    Types: Cigarettes    Quit date: 09/18/2013    Years since quitting: 8.2   Smokeless tobacco: Never   Tobacco comments:    pt smoked off and on for about 60 years, reports stopping several times and then starting back  Vaping Use   Vaping Use: Never used  Substance and Sexual Activity   Alcohol use: Yes    Comment: occasionally   Drug use: No   Sexual activity: Not on file  Other Topics Concern   Not on file  Social History Narrative   4 children, one is decreased   Original from [place]   Exercise- no   Diet- she is careful w/ diet   Will spend 2  months in [place] this winter    Social Determinants of Health   Financial Resource Strain: Not on file  Food Insecurity: Not on file  Transportation Needs: Not on file  Physical Activity: Not on file  Stress: Not on file  Social Connections: Not on file  Intimate Partner Violence: Not on file    Family History  Problem Relation Age of Onset   Stroke Mother    Hypertension Father    Breast cancer Sister    Birth defects Sister    Diabetes Brother    Heart disease Brother        CABG    Review of Systems:  As stated in the HPI and otherwise negative.   BP 122/70   Pulse 96   Ht '5\' 9"'$  (1.753 m)   Wt 132 lb (59.9 kg)   SpO2 98%   BMI 19.49 kg/m   Physical Examination:  General: Well developed, well nourished, NAD  HEENT: OP clear, mucus membranes moist  SKIN: warm, dry. No rashes. Neuro: No focal deficits  Musculoskeletal: Muscle strength 5/5 all ext  Psychiatric: Mood and affect normal  Neck: No JVD, no carotid bruits, no thyromegaly, no lymphadenopathy.  Lungs:Clear bilaterally, no wheezes, rhonci, crackles Cardiovascular: Regular rate and rhythm. No murmurs, gallops or rubs. Abdomen:Soft. Bowel sounds present. Non-tender.  Extremities: No lower extremity edema. Pulses are 2 + in the bilateral DP/PT.  EKG:  EKG is not ordered today. The ekg ordered today demonstrates   Echo 11/09/21: 1. Left ventricular ejection fraction, by estimation, is 60 to 65%. The  left ventricle has normal function. The left ventricle has no regional  wall motion abnormalities. There is moderate asymmetric left ventricular  hypertrophy of the basal-septal  segment. Left ventricular diastolic parameters were normal.   2. Right ventricular systolic function is normal. The right ventricular  size is normal. Tricuspid regurgitation signal is inadequate for assessing  PA pressure.   3. The mitral valve is normal in structure. Trivial mitral valve  regurgitation. No evidence of mitral  stenosis.   4. The aortic valve is tricuspid. There is mild calcification of the  aortic valve. There is mild thickening of the aortic valve. Aortic valve  regurgitation is mild. Aortic valve sclerosis/calcification is present,  without any evidence of aortic  stenosis.   5. Aortic dilatation noted. There is mild dilatation of the ascending  aorta, measuring 43 mm.   Recent Labs: No results found for requested labs within last 365 days.   Lipid Panel    Component Value Date/Time   CHOL 244 (H) 10/18/2010 0921   TRIG 50.0 10/18/2010 0921   TRIG 59 04/16/2006 1107   HDL 100.20 10/18/2010 0921   CHOLHDL  2 10/18/2010 0921   VLDL 10.0 10/18/2010 0921   LDLCALC 172 (H) 09/12/2007 0841   LDLDIRECT 152.2 10/18/2010 0921     Wt Readings from Last 3 Encounters:  12/26/21 132 lb (59.9 kg)  10/24/21 135 lb (61.2 kg)  01/08/20 141 lb (64 kg)    Assessment and Plan:   1. Syncope: No clear etiology. LV function is normal by echo in June 2023. Cardiac monitor without any AV block or bradycardia. BP is normal. Carotid artery dopplers ok. Vasovagal syncope likely. She should be ok to drive.    2. Thoracic aortic aneurysm: will arrange a chest CTA to evaluate  3. Aortic insufficiency: Mild by echo in June 2023. Repeat echo in two years.   Labs/ tests ordered today include:   Orders Placed This Encounter  Procedures   CT ANGIO CHEST AORTA W/CM & OR WO/CM   Basic Metabolic Panel (BMET)   Disposition:   F/U with me in 12 months.    Signed, Lauree Chandler, MD 12/26/2021 11:23 AM    [facility] [address], [place], [place]  [address] Phone: [phone]; Fax: [phone]

## 2021-12-26 NOTE — Patient Instructions (Addendum)
Medication Instructions:  No changes *If you need a refill on your cardiac medications before your next appointment, please call your pharmacy*   Lab Work: Today: BMET If you have labs (blood work) drawn today and your tests are completely normal, you will receive your results only by: Veteran (if you have MyChart) OR A paper copy in the mail If you have any lab test that is abnormal or we need to change your treatment, we will call you to review the results.   Testing/Procedures: Chest CT Aorta   Follow-Up: At [facility], you and your health needs are our priority.  As part of our continuing mission to provide you with exceptional heart care, we have created designated Provider Care Teams.  These Care Teams include your primary Cardiologist (physician) and Advanced Practice Providers (APPs -  Physician Assistants and Nurse Practitioners) who all work together to provide you with the care you need, when you need it.   Your next appointment:   12 month(s)  The format for your next appointment:   In Person  Provider:   Lauree Chandler, MD     Important Information About Sugar

## 2021-12-28 DIAGNOSIS — D0439 Carcinoma in situ of skin of other parts of face: Secondary | ICD-10-CM | POA: Diagnosis not present

## 2021-12-28 DIAGNOSIS — D485 Neoplasm of uncertain behavior of skin: Secondary | ICD-10-CM | POA: Diagnosis not present

## 2021-12-31 ENCOUNTER — Ambulatory Visit (HOSPITAL_BASED_OUTPATIENT_CLINIC_OR_DEPARTMENT_OTHER): Admission: RE | Admit: 2021-12-31 | Payer: Medicare HMO | Source: Ambulatory Visit

## 2022-01-03 DIAGNOSIS — R2681 Unsteadiness on feet: Secondary | ICD-10-CM | POA: Diagnosis not present

## 2022-01-03 DIAGNOSIS — M6281 Muscle weakness (generalized): Secondary | ICD-10-CM | POA: Diagnosis not present

## 2022-01-03 DIAGNOSIS — Z9181 History of falling: Secondary | ICD-10-CM | POA: Diagnosis not present

## 2022-01-03 DIAGNOSIS — R2689 Other abnormalities of gait and mobility: Secondary | ICD-10-CM | POA: Diagnosis not present

## 2022-01-04 DIAGNOSIS — Z9181 History of falling: Secondary | ICD-10-CM | POA: Diagnosis not present

## 2022-01-04 DIAGNOSIS — R2681 Unsteadiness on feet: Secondary | ICD-10-CM | POA: Diagnosis not present

## 2022-01-04 DIAGNOSIS — M6281 Muscle weakness (generalized): Secondary | ICD-10-CM | POA: Diagnosis not present

## 2022-01-04 DIAGNOSIS — R2689 Other abnormalities of gait and mobility: Secondary | ICD-10-CM | POA: Diagnosis not present

## 2022-01-05 ENCOUNTER — Ambulatory Visit (HOSPITAL_BASED_OUTPATIENT_CLINIC_OR_DEPARTMENT_OTHER)
Admission: RE | Admit: 2022-01-05 | Discharge: 2022-01-05 | Disposition: A | Discharge status: Home / Self Care | Payer: Medicare HMO | Source: Ambulatory Visit | Attending: Cardiovascular Disease | Admitting: Cardiovascular Disease

## 2022-01-05 DIAGNOSIS — I712 Thoracic aortic aneurysm, without rupture, unspecified: Secondary | ICD-10-CM | POA: Insufficient documentation

## 2022-01-05 DIAGNOSIS — J439 Emphysema, unspecified: Secondary | ICD-10-CM | POA: Diagnosis not present

## 2022-01-05 MED ORDER — IOHEXOL 350 MG/ML SOLN
80.0000 mL | Freq: Once | INTRAVENOUS | Status: AC | PRN
Start: 2022-01-05 — End: 2022-01-05
  Administered 2022-01-05: 80 mL via INTRAVENOUS

## 2022-01-06 ENCOUNTER — Ambulatory Visit: Payer: Medicare HMO | Admitting: Physician Assistant

## 2022-01-06 VITALS — BP 100/63 | HR 99 | Temp 97.8°F | Ht 66.0 in | Wt 129.9 lb

## 2022-01-06 DIAGNOSIS — I739 Peripheral vascular disease, unspecified: Secondary | ICD-10-CM | POA: Diagnosis not present

## 2022-01-06 DIAGNOSIS — I70211 Atherosclerosis of native arteries of extremities with intermittent claudication, right leg: Secondary | ICD-10-CM | POA: Diagnosis not present

## 2022-01-06 NOTE — Progress Notes (Addendum)
Office Note     CC:  follow up Requesting Provider:  Donnajean Lopes, MD  HPI: Christie Peters is a 86 y.o. (20-Sep-1935) female who presents for surveillance of PAD.  She was last seen in April 2021.  Prior to that she had been seen for several years for monitoring of right calf claudication symptoms.  Patient states over the past several months the distance before onset of claudication symptoms has shortened.  It is beginning to impact her lifestyle.  She also reports some inconsistent, periodic pain in her right foot at night however there is no regular pattern to this.  She denies any tissue loss of the right foot.  She does not take an aspirin.  She is a former smoker.  She is on a statin daily.  She denies any symptoms of the left lower extremity.    Past Medical History:  Diagnosis Date   Dental crowns present    Dupuytren's contracture of left hand 09/2014   left middle finger   Hyperlipidemia    Hypertension    states under control with meds., has been on med. x 2 yr.    Past Surgical History:  Procedure Laterality Date   BLEPHAROPLASTY Bilateral 02/17/2013   upper lid   CATARACT EXTRACTION W/ INTRAOCULAR LENS  IMPLANT, BILATERAL     CESAREAN SECTION     COLONOSCOPY WITH PROPOFOL  03/21/2013   FASCIOTOMY Left 10/01/2014   Procedure: FASCIOTOMY LEFT MIDDLE FINGER, FASCIOTOMY LEFT RING FINGER,  FASCIOTOMY LEFT SMALL FINGER ;  Surgeon: [name], MD;  Location: [facility];  Service: Orthopedics;  Laterality: Left;    Social History   Socioeconomic History   Marital status: Married    Spouse name: Not on file   Number of children: 3   Years of education: Not on file   Highest education level: Not on file  Occupational History   Occupation: retired   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  Tobacco Use   Smoking status: Former    Packs/day: 0.50    Years: 60.00    Total pack years: 30.00    Types: Cigarettes    Quit date: 09/18/2013    Years  since quitting: 8.3   Smokeless tobacco: Never   Tobacco comments:    pt smoked off and on for about 60 years, reports stopping several times and then starting back  Vaping Use   Vaping Use: Never used  Substance and Sexual Activity   Alcohol use: Yes    Comment: occasionally   Drug use: No   Sexual activity: Not on file  Other Topics Concern   Not on file  Social History Narrative   4 children, one is decreased   Original from [place]   Exercise- no   Diet- she is careful w/ diet   Will spend 2 months in [place] this winter    Social Determinants of Health   Financial Resource Strain: Not on file  Food Insecurity: Not on file  Transportation Needs: Not on file  Physical Activity: Not on file  Stress: Not on file  Social Connections: Not on file  Intimate Partner Violence: Not on file    Family History  Problem Relation Age of Onset   Stroke Mother    Hypertension Father    Breast cancer Sister    Birth defects Sister    Diabetes Brother    Heart disease Brother        CABG    Current  Outpatient Medications  Medication Sig Dispense Refill   benazepril (LOTENSIN) 20 MG tablet Take 20 mg by mouth daily.     Calcium Carbonate-Vitamin D (CALTRATE 600+D PO) Take by mouth.     Fluticasone-Umeclidin-Vilant (TRELEGY ELLIPTA IN) Inhale 1 puff into the lungs daily.     hydrochlorothiazide (HYDRODIURIL) 25 MG tablet TAKE ONE TABLET BY MOUTH EVERY DAY 30 tablet 0   pravastatin (PRAVACHOL) 10 MG tablet Take 10 mg by mouth at bedtime.     Propylene Glycol 0.95 % SOLN Place 1 drop into both eyes daily.     No current facility-administered medications for this visit.    Allergies  Allergen Reactions   Levofloxacin Itching and Rash     REVIEW OF SYSTEMS:   '[X]'$  denotes positive finding, '[ ]'$  denotes negative finding Cardiac  Comments:  Chest pain or chest pressure:    Shortness of breath upon exertion:    Short of breath when lying flat:    Irregular heart rhythm:         Vascular    Pain in calf, thigh, or hip brought on by ambulation:    Pain in feet at night that wakes you up from your sleep:     Blood clot in your veins:    Leg swelling:         Pulmonary    Oxygen at home:    Productive cough:     Wheezing:         Neurologic    Sudden weakness in arms or legs:     Sudden numbness in arms or legs:     Sudden onset of difficulty speaking or slurred speech:    Temporary loss of vision in one eye:     Problems with dizziness:         Gastrointestinal    Blood in stool:     Vomited blood:         Genitourinary    Burning when urinating:     Blood in urine:        Psychiatric    Major depression:         Hematologic    Bleeding problems:    Problems with blood clotting too easily:        Skin    Rashes or ulcers:        Constitutional    Fever or chills:      PHYSICAL EXAMINATION:  Vitals:   01/06/22 1239  BP: 100/63  Pulse: 99  Temp: 97.8 F (36.6 C)  TempSrc: Temporal  SpO2: 99%  Weight: 129 lb 14.4 oz (58.9 kg)  Height: '5\' 6"'$  (1.676 m)    General:  WDWN in NAD; vital signs documented above Gait: Not observed HENT: WNL, normocephalic Pulmonary: normal non-labored breathing , without Rales, rhonchi,  wheezing Cardiac: regular HR Abdomen: soft, NT, no masses Skin: without rashes Vascular Exam/Pulses:  Right Left  Radial 2+ (normal) 2+ (normal)  Femoral 2+ (normal) 2+ (normal)  DP absent absent  PT absent absent   Extremities: without ischemic changes, without Gangrene , without cellulitis; without open wounds; varicosities of medial lower legs without pain Musculoskeletal: no muscle wasting or atrophy  Neurologic: A&O X 3;  No focal weakness or paresthesias are detected Psychiatric:  The pt has Normal affect.   Non-Invasive Vascular Imaging:   ABI/TBIToday's ABIToday's TBIPrevious ABIPrevious TBI  +-------+-----------+-----------+------------+------------+  Right  0.53       0.46       0.76  0.46          +-------+-----------+-----------+------------+------------+  Left   0.59       0.38       0.57        0.37          +-------+-----------+-----------+------------+------------+    ASSESSMENT/PLAN:: 86 y.o. female with worsening claudication of the right lower extremity  -Ms. Christie Peters is an 86 year old female who has been seen over the years with right calf claudication and known PAD.  The distance before claudication symptom onset has worsened recently.  Symptoms are beginning to impact her lifestyle.  She does complain of periodic pain in her foot overnight however this does not occur nightly and seems somewhat vague.  She does not have any tissue loss of the right foot.  We discussed the next step being angiography.  After discussing the procedure including risks, patient was hesitant to proceed.  For the time being she would like to continue her walking program.  She will also start a baby aspirin daily.  Since her symptoms have worsened and she is nearing a decision to proceed with angiography we will bring her back with repeat ABIs in 3 months for reevaluation.  She knows to call/return office sooner if symptoms worsen or if she develops regular rest pain.  It should also be noted that CTA chest performed yesterday demonstrated a 4.7 cm ascending aortic aneurysm.  She will need referral to cardiothoracic surgery for ongoing surveillance.  We will be happy to facilitate this if necessary.   Dagoberto Ligas, PA-C [facility] [phone]  Clinic MD:   Virl Cagey

## 2022-01-11 ENCOUNTER — Telehealth: Payer: Self-pay | Admitting: *Deleted

## 2022-01-11 DIAGNOSIS — I712 Thoracic aortic aneurysm, without rupture, unspecified: Secondary | ICD-10-CM

## 2022-01-11 MED ORDER — ASPIRIN 81 MG PO TBEC: 81.0000 mg | DELAYED_RELEASE_TABLET | Freq: Every day | ORAL | 3 refills | Status: AC

## 2022-01-11 NOTE — Telephone Encounter (Signed)
-----   Message from Burnell Blanks, MD sent at 01/08/2022  5:29 PM EDT ----- Can we let her know that her dilated ascending aorta is stable? She will need to be referred to CT surgery for them to follow. We can let her know too that her scan showed evidence of some plaque in the coronary arteries but in the absence of angina or dyspnea, no further workup right now. I would recommend starting an ASA 81 mg daily. Continue statin. cdm

## 2022-01-11 NOTE — Telephone Encounter (Signed)
I spoke with the patient.  Reviewed results of chest cta w her.  She will begin to take aspirin 81 mg daily and is aware a referral has been placed to [facility] to follow aneurysm.     Copy of CT to PCP per patient request.

## 2022-01-16 ENCOUNTER — Other Ambulatory Visit: Payer: Self-pay

## 2022-01-16 DIAGNOSIS — I739 Peripheral vascular disease, unspecified: Secondary | ICD-10-CM

## 2022-01-26 DIAGNOSIS — Z9181 History of falling: Secondary | ICD-10-CM | POA: Diagnosis not present

## 2022-01-26 DIAGNOSIS — M810 Age-related osteoporosis without current pathological fracture: Secondary | ICD-10-CM | POA: Diagnosis not present

## 2022-01-26 DIAGNOSIS — I10 Essential (primary) hypertension: Secondary | ICD-10-CM | POA: Diagnosis not present

## 2022-01-26 DIAGNOSIS — Z87891 Personal history of nicotine dependence: Secondary | ICD-10-CM | POA: Diagnosis not present

## 2022-01-26 DIAGNOSIS — Z7951 Long term (current) use of inhaled steroids: Secondary | ICD-10-CM | POA: Diagnosis not present

## 2022-01-26 DIAGNOSIS — E785 Hyperlipidemia, unspecified: Secondary | ICD-10-CM | POA: Diagnosis not present

## 2022-01-26 DIAGNOSIS — K5900 Constipation, unspecified: Secondary | ICD-10-CM | POA: Diagnosis not present

## 2022-01-26 DIAGNOSIS — J439 Emphysema, unspecified: Secondary | ICD-10-CM | POA: Diagnosis not present

## 2022-01-26 DIAGNOSIS — Z79620 Long term (current) use of immunosuppressive biologic: Secondary | ICD-10-CM | POA: Diagnosis not present

## 2022-01-26 DIAGNOSIS — I951 Orthostatic hypotension: Secondary | ICD-10-CM | POA: Diagnosis not present

## 2022-01-26 DIAGNOSIS — I70219 Atherosclerosis of native arteries of extremities with intermittent claudication, unspecified extremity: Secondary | ICD-10-CM | POA: Diagnosis not present

## 2022-01-26 DIAGNOSIS — Z7982 Long term (current) use of aspirin: Secondary | ICD-10-CM | POA: Diagnosis not present

## 2022-02-01 DIAGNOSIS — M6281 Muscle weakness (generalized): Secondary | ICD-10-CM | POA: Diagnosis not present

## 2022-02-01 DIAGNOSIS — Z9181 History of falling: Secondary | ICD-10-CM | POA: Diagnosis not present

## 2022-02-01 DIAGNOSIS — R2689 Other abnormalities of gait and mobility: Secondary | ICD-10-CM | POA: Diagnosis not present

## 2022-02-01 DIAGNOSIS — R2681 Unsteadiness on feet: Secondary | ICD-10-CM | POA: Diagnosis not present

## 2022-02-20 DIAGNOSIS — M79673 Pain in unspecified foot: Secondary | ICD-10-CM | POA: Diagnosis not present

## 2022-02-20 DIAGNOSIS — I998 Other disorder of circulatory system: Secondary | ICD-10-CM | POA: Diagnosis not present

## 2022-02-20 DIAGNOSIS — I739 Peripheral vascular disease, unspecified: Secondary | ICD-10-CM | POA: Diagnosis not present

## 2022-02-24 ENCOUNTER — Ambulatory Visit (HOSPITAL_COMMUNITY)
Admission: RE | Admit: 2022-02-24 | Discharge: 2022-02-24 | Disposition: A | Discharge status: Home / Self Care | Payer: Medicare HMO | Source: Ambulatory Visit | Attending: Vascular Surgery | Admitting: Vascular Surgery

## 2022-02-24 ENCOUNTER — Encounter: Payer: Self-pay | Admitting: Physician Assistant

## 2022-02-24 ENCOUNTER — Other Ambulatory Visit: Payer: Self-pay

## 2022-02-24 ENCOUNTER — Ambulatory Visit (INDEPENDENT_AMBULATORY_CARE_PROVIDER_SITE_OTHER): Payer: Medicare HMO | Admitting: Physician Assistant

## 2022-02-24 VITALS — BP 122/83 | HR 78 | Temp 98.1°F | Resp 14 | Ht 66.0 in | Wt 130.0 lb

## 2022-02-24 DIAGNOSIS — I70211 Atherosclerosis of native arteries of extremities with intermittent claudication, right leg: Secondary | ICD-10-CM | POA: Diagnosis not present

## 2022-02-24 DIAGNOSIS — I739 Peripheral vascular disease, unspecified: Secondary | ICD-10-CM

## 2022-02-24 NOTE — Progress Notes (Signed)
Office Note     CC:  follow up Requesting Provider:  Donnajean Lopes, MD  HPI: Christie Peters is a 86 y.o. (07-10-1935) female who presents for earlier follow up at the request of her PCP for worsening RLE claudication. She was recently seen at the end of August.  She has long history of RLE claudication but this has not been disabling. At her recent visit her symptoms were becoming worse and beginning to impact her lifestyle.  She also reported some inconsistent, periodic pain in her right foot at night however there is no regular pattern to this. Matt PA-C discussed the next step being angiography.  After discussing the procedure including risks, patient was hesitant to proceed.  For the time being she elected to continue her walking program.   Today she reports that she is having numbness and tingling in her right foot now. She is having "just a pain" in her right foot at night and also cramping/ throbbing like pain in her calf that is occurring at night and also on ambulation. The pain that is waking her up is not every night but frequently. This has worsened since her last visit about 6 weeks ago. She can walk about 10 minutes before she has to stop and rest. She explains that she does not do a lot of walking but does walk around her apartment all day. She does not have any tissue loss.   The pt is on a statin for cholesterol management.  The pt is on a daily aspirin.   Other AC:  none The pt is on ACE, HCTZ for hypertension.   The pt is not diabetic.  Tobacco hx:  Former  Past Medical History:  Diagnosis Date   Dental crowns present    Dupuytren's contracture of left hand 09/2014   left middle finger   Hyperlipidemia    Hypertension    states under control with meds., has been on med. x 2 yr.    Past Surgical History:  Procedure Laterality Date   BLEPHAROPLASTY Bilateral 02/17/2013   upper lid   CATARACT EXTRACTION W/ INTRAOCULAR LENS  IMPLANT, BILATERAL     CESAREAN  SECTION     COLONOSCOPY WITH PROPOFOL  03/21/2013   FASCIOTOMY Left 10/01/2014   Procedure: FASCIOTOMY LEFT MIDDLE FINGER, FASCIOTOMY LEFT RING FINGER,  FASCIOTOMY LEFT SMALL FINGER ;  Surgeon: [name], MD;  Location: [facility];  Service: Orthopedics;  Laterality: Left;    Social History   Socioeconomic History   Marital status: Married    Spouse name: Not on file   Number of children: 3   Years of education: Not on file   Highest education level: Not on file  Occupational History   Occupation: retired   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  Tobacco Use   Smoking status: Former    Packs/day: 0.50    Years: 60.00    Total pack years: 30.00    Types: Cigarettes    Quit date: 09/18/2013    Years since quitting: 8.4   Smokeless tobacco: Never   Tobacco comments:    pt smoked off and on for about 60 years, reports stopping several times and then starting back  Vaping Use   Vaping Use: Never used  Substance and Sexual Activity   Alcohol use: Yes    Comment: occasionally   Drug use: No   Sexual activity: Not on file  Other Topics Concern   Not on file  Social  History Narrative   4 children, one is decreased   Original from [place]   Exercise- no   Diet- she is careful w/ diet   Will spend 2 months in [place] this winter    Social Determinants of Health   Financial Resource Strain: Not on file  Food Insecurity: Not on file  Transportation Needs: Not on file  Physical Activity: Not on file  Stress: Not on file  Social Connections: Not on file  Intimate Partner Violence: Not on file    Family History  Problem Relation Age of Onset   Stroke Mother    Hypertension Father    Breast cancer Sister    Birth defects Sister    Diabetes Brother    Heart disease Brother        CABG    Current Outpatient Medications  Medication Sig Dispense Refill   aspirin EC 81 MG tablet Take 1 tablet (81 mg total) by mouth daily. Swallow whole. 90 tablet 3    benazepril (LOTENSIN) 20 MG tablet Take 20 mg by mouth daily.     Calcium Carbonate-Vitamin D (CALTRATE 600+D PO) Take by mouth.     Fluticasone-Umeclidin-Vilant (TRELEGY ELLIPTA IN) Inhale 1 puff into the lungs daily.     hydrochlorothiazide (HYDRODIURIL) 25 MG tablet TAKE ONE TABLET BY MOUTH EVERY DAY 30 tablet 0   pravastatin (PRAVACHOL) 10 MG tablet Take 10 mg by mouth at bedtime.     Propylene Glycol 0.95 % SOLN Place 1 drop into both eyes daily.     No current facility-administered medications for this visit.    Allergies  Allergen Reactions   Levofloxacin Itching and Rash     REVIEW OF SYSTEMS:   '[X]'$  denotes positive finding, '[ ]'$  denotes negative finding Cardiac  Comments:  Chest pain or chest pressure:    Shortness of breath upon exertion:    Short of breath when lying flat:    Irregular heart rhythm:        Vascular    Pain in calf, thigh, or hip brought on by ambulation:    Pain in feet at night that wakes you up from your sleep:     Blood clot in your veins:    Leg swelling:         Pulmonary    Oxygen at home:    Productive cough:     Wheezing:         Neurologic    Sudden weakness in arms or legs:     Sudden numbness in arms or legs:     Sudden onset of difficulty speaking or slurred speech:    Temporary loss of vision in one eye:     Problems with dizziness:         Gastrointestinal    Blood in stool:     Vomited blood:         Genitourinary    Burning when urinating:     Blood in urine:        Psychiatric    Major depression:         Hematologic    Bleeding problems:    Problems with blood clotting too easily:        Skin    Rashes or ulcers:        Constitutional    Fever or chills:      PHYSICAL EXAMINATION:  Vitals:   02/24/22 1031  BP: 122/83  Pulse: 78  Resp: 14  Temp: 98.1 F (  36.7 C)  TempSrc: Temporal  SpO2: 99%  Weight: 130 lb (59 kg)  Height: '5\' 6"'$  (1.676 m)    General:  WDWN in NAD; vital signs documented  above Gait: Normal HENT: WNL, normocephalic Pulmonary: normal non-labored breathing , without wheezing Cardiac: regular HR, without  Murmurs without carotid bruit Abdomen: soft, NT, no masses Vascular Exam/Pulses:  Right Left  Radial 2+ (normal) 2+ (normal)  Femoral 2+ (normal) 2+ (normal)  Popliteal Not palpable Not palpable  DP absent 1+ (weak)  PT absent absent   Extremities: without ischemic changes, without Gangrene , without cellulitis; without open wounds; feet are warm and well perfused. Varicose veins BLE and reticular veins bilaterally Musculoskeletal: no muscle wasting or atrophy  Neurologic: A&O X 3;  No focal weakness or paresthesias are detected Psychiatric:  The pt has Normal affect.   Non-Invasive Vascular Imaging:   +-------+-----------+-----------+------------+------------+  ABI/TBIToday's ABIToday's TBIPrevious ABIPrevious TBI  +-------+-----------+-----------+------------+------------+  Right  0.35       0.29       0.53        0.46          +-------+-----------+-----------+------------+------------+  Left   0.64       0.33       0.59        0.38          +-------+-----------+-----------+------------+------------+    ASSESSMENT/PLAN:: 86 y.o. female here for follow up for worsening claudication and what sounds like rest pain in right lower extremity. She was seen just 6 weeks ago and  her symptoms have progressed since then. Her ABI has decreased from her prior study on the right. Was 0.53 last time and now 0.35 cm. Initially she was hesitant to proceed with Angiogram but now that her symptoms have worsened she is agreeable to proceed as her symptoms are now disabling. I re discussed risks/ benefits/ alternatives of Angiogram with the patient and answered her question. She requested to meet Dr. Virl Cagey, who will perform her Angiogram. He also reiterated risks/benefits of the Angiogram and also possibility that if no endovascular options are available  they would discuss afterwards any possible surgical intervention. We will work on scheduling the Angiogram in the near future with Dr. Virl Cagey.     Karoline Caldwell, PA-C [facility] [phone]  Clinic MD:   Virl Cagey

## 2022-03-08 ENCOUNTER — Ambulatory Visit (HOSPITAL_COMMUNITY)
Admission: RE | Disposition: A | Discharge status: Home / Self Care | Payer: Self-pay | Source: Ambulatory Visit | Attending: Vascular Surgery

## 2022-03-08 ENCOUNTER — Ambulatory Visit (HOSPITAL_COMMUNITY)
Admission: RE | Admit: 2022-03-08 | Discharge: 2022-03-08 | Disposition: A | Discharge status: Home / Self Care | Payer: Medicare HMO | Source: Ambulatory Visit | Attending: Vascular Surgery | Admitting: Vascular Surgery

## 2022-03-08 ENCOUNTER — Other Ambulatory Visit: Payer: Self-pay

## 2022-03-08 DIAGNOSIS — I739 Peripheral vascular disease, unspecified: Secondary | ICD-10-CM

## 2022-03-08 DIAGNOSIS — Z87891 Personal history of nicotine dependence: Secondary | ICD-10-CM | POA: Diagnosis not present

## 2022-03-08 DIAGNOSIS — I10 Essential (primary) hypertension: Secondary | ICD-10-CM | POA: Diagnosis not present

## 2022-03-08 DIAGNOSIS — Z79899 Other long term (current) drug therapy: Secondary | ICD-10-CM | POA: Diagnosis not present

## 2022-03-08 DIAGNOSIS — I70211 Atherosclerosis of native arteries of extremities with intermittent claudication, right leg: Secondary | ICD-10-CM | POA: Insufficient documentation

## 2022-03-08 DIAGNOSIS — I708 Atherosclerosis of other arteries: Secondary | ICD-10-CM | POA: Diagnosis not present

## 2022-03-08 DIAGNOSIS — E785 Hyperlipidemia, unspecified: Secondary | ICD-10-CM | POA: Insufficient documentation

## 2022-03-08 DIAGNOSIS — Z7982 Long term (current) use of aspirin: Secondary | ICD-10-CM | POA: Insufficient documentation

## 2022-03-08 HISTORY — PX: PERIPHERAL VASCULAR INTERVENTION: CATH118257

## 2022-03-08 HISTORY — PX: ABDOMINAL AORTOGRAM W/LOWER EXTREMITY: CATH118223

## 2022-03-08 LAB — POCT I-STAT, CHEM 8
BUN: 11 mg/dL (ref 8–23)
Calcium, Ion: 1.29 mmol/L (ref 1.15–1.40)
Chloride: 98 mmol/L (ref 98–111)
Creatinine, Ser: 0.6 mg/dL (ref 0.44–1.00)
Glucose, Bld: 77 mg/dL (ref 70–99)
HCT: 37 % (ref 36.0–46.0)
Hemoglobin: 12.6 g/dL (ref 12.0–15.0)
Potassium: 4.1 mmol/L (ref 3.5–5.1)
Sodium: 138 mmol/L (ref 135–145)
TCO2: 29 mmol/L (ref 22–32)

## 2022-03-08 LAB — POCT ACTIVATED CLOTTING TIME: Activated Clotting Time: 173 seconds

## 2022-03-08 SURGERY — ABDOMINAL AORTOGRAM W/LOWER EXTREMITY
Anesthesia: LOCAL | Laterality: Right

## 2022-03-08 MED ORDER — FENTANYL CITRATE (PF) 100 MCG/2ML IJ SOLN
INTRAMUSCULAR | Status: DC | PRN
  Administered 2022-03-08: 50 ug via INTRAVENOUS

## 2022-03-08 MED ORDER — SODIUM CHLORIDE 0.9% FLUSH: 3.0000 mL | Freq: Two times a day (BID) | INTRAVENOUS | Status: DC

## 2022-03-08 MED ORDER — SODIUM CHLORIDE 0.9 % IV SOLN: 250.0000 mL | INTRAVENOUS | Status: DC | PRN

## 2022-03-08 MED ORDER — CLOPIDOGREL BISULFATE 300 MG PO TABS
ORAL_TABLET | ORAL | Status: DC | PRN
  Administered 2022-03-08: 150 mg via ORAL

## 2022-03-08 MED ORDER — LIDOCAINE HCL (PF) 1 % IJ SOLN
INTRAMUSCULAR | Status: DC | PRN
  Administered 2022-03-08: 20 mL via INTRADERMAL

## 2022-03-08 MED ORDER — MIDAZOLAM HCL 2 MG/2ML IJ SOLN
INTRAMUSCULAR | Status: AC
  Filled 2022-03-08: qty 2

## 2022-03-08 MED ORDER — ONDANSETRON HCL 4 MG/2ML IJ SOLN: 4.0000 mg | Freq: Four times a day (QID) | INTRAMUSCULAR | Status: DC | PRN

## 2022-03-08 MED ORDER — FENTANYL CITRATE (PF) 100 MCG/2ML IJ SOLN
INTRAMUSCULAR | Status: AC
  Filled 2022-03-08: qty 2

## 2022-03-08 MED ORDER — CLOPIDOGREL BISULFATE 75 MG PO TABS: 75.0000 mg | ORAL_TABLET | Freq: Every day | ORAL | Status: DC

## 2022-03-08 MED ORDER — ASPIRIN 81 MG PO TBEC: 81.0000 mg | DELAYED_RELEASE_TABLET | Freq: Every day | ORAL | Status: DC

## 2022-03-08 MED ORDER — LABETALOL HCL 5 MG/ML IV SOLN: 10.0000 mg | INTRAVENOUS | Status: DC | PRN

## 2022-03-08 MED ORDER — CLOPIDOGREL BISULFATE 75 MG PO TABS
ORAL_TABLET | ORAL | Status: AC
  Filled 2022-03-08: qty 2

## 2022-03-08 MED ORDER — IODIXANOL 320 MG/ML IV SOLN
INTRAVENOUS | Status: DC | PRN
  Administered 2022-03-08: 140 mL via INTRA_ARTERIAL

## 2022-03-08 MED ORDER — HYDRALAZINE HCL 20 MG/ML IJ SOLN
INTRAMUSCULAR | Status: AC
  Filled 2022-03-08: qty 1

## 2022-03-08 MED ORDER — CLOPIDOGREL BISULFATE 75 MG PO TABS: 75.0000 mg | ORAL_TABLET | Freq: Every day | ORAL | 11 refills | Status: AC

## 2022-03-08 MED ORDER — HYDRALAZINE HCL 20 MG/ML IJ SOLN
5.0000 mg | INTRAMUSCULAR | Status: DC | PRN
  Administered 2022-03-08: 5 mg via INTRAVENOUS

## 2022-03-08 MED ORDER — HEPARIN (PORCINE) IN NACL 1000-0.9 UT/500ML-% IV SOLN
INTRAVENOUS | Status: DC | PRN
  Administered 2022-03-08 (×2): 500 mL

## 2022-03-08 MED ORDER — SODIUM CHLORIDE 0.9 % WEIGHT BASED INFUSION
1.0000 mL/kg/h | INTRAVENOUS | Status: DC
  Administered 2022-03-08: 1 mL/kg/h via INTRAVENOUS

## 2022-03-08 MED ORDER — SODIUM CHLORIDE 0.9 % NICU IV INFUSION SIMPLE
500.0000 mL | INJECTION | Freq: Once | INTRAVENOUS | Status: AC
  Administered 2022-03-08: 500 mL via INTRAVENOUS

## 2022-03-08 MED ORDER — FENTANYL CITRATE (PF) 100 MCG/2ML IJ SOLN
25.0000 ug | Freq: Once | INTRAMUSCULAR | Status: AC
  Administered 2022-03-08: 25 ug via INTRAVENOUS

## 2022-03-08 MED ORDER — SODIUM CHLORIDE 0.9% FLUSH: 3.0000 mL | INTRAVENOUS | Status: DC | PRN

## 2022-03-08 MED ORDER — LIDOCAINE-EPINEPHRINE 1 %-1:100000 IJ SOLN
INTRAMUSCULAR | Status: AC
  Filled 2022-03-08: qty 1

## 2022-03-08 MED ORDER — ACETAMINOPHEN 325 MG PO TABS: 650.0000 mg | ORAL_TABLET | ORAL | Status: DC | PRN

## 2022-03-08 MED ORDER — SODIUM CHLORIDE 0.9 % IV SOLN: INTRAVENOUS | Status: DC

## 2022-03-08 MED ORDER — HEPARIN (PORCINE) IN NACL 1000-0.9 UT/500ML-% IV SOLN
INTRAVENOUS | Status: AC
Start: 2022-03-08 — End: ?
  Filled 2022-03-08: qty 500

## 2022-03-08 MED ORDER — MIDAZOLAM HCL 2 MG/2ML IJ SOLN
INTRAMUSCULAR | Status: DC | PRN
  Administered 2022-03-08: 1 mg via INTRAVENOUS

## 2022-03-08 MED ORDER — HEPARIN SODIUM (PORCINE) 1000 UNIT/ML IJ SOLN
INTRAMUSCULAR | Status: DC | PRN
  Administered 2022-03-08: 5000 [IU] via INTRAVENOUS

## 2022-03-08 SURGICAL SUPPLY — 22 items
BALLN MUSTANG 7.0X20 75 (BALLOONS) ×2
BALLOON MUSTANG 7.0X20 75 (BALLOONS) IMPLANT
CATH OMNI FLUSH 5F 65CM (CATHETERS) IMPLANT
CATH STRAIGHT 5FR 65CM (CATHETERS) IMPLANT
DEVICE CONTINUOUS FLUSH (MISCELLANEOUS) IMPLANT
GLIDEWIRE ADV .035X260CM (WIRE) IMPLANT
KIT ENCORE 26 ADVANTAGE (KITS) IMPLANT
KIT MICROPUNCTURE NIT STIFF (SHEATH) IMPLANT
KIT PV (KITS) ×2 IMPLANT
SHEATH CATAPULT 6FR 45 (SHEATH) IMPLANT
SHEATH PINNACLE 5F 10CM (SHEATH) IMPLANT
SHEATH PINNACLE 6F 10CM (SHEATH) IMPLANT
SHEATH PROBE COVER 6X72 (BAG) IMPLANT
STENT EXPRESS LD 8X27X75 (Permanent Stent) IMPLANT
STENT INNOVA 8X20X130 (Permanent Stent) IMPLANT
STOPCOCK MORSE 400PSI 3WAY (MISCELLANEOUS) IMPLANT
SYR MEDRAD MARK V 150ML (SYRINGE) IMPLANT
TRANSDUCER W/STOPCOCK (MISCELLANEOUS) ×2 IMPLANT
TRAY PV CATH (CUSTOM PROCEDURE TRAY) ×2 IMPLANT
TUBING CIL FLEX 10 FLL-RA (TUBING) IMPLANT
WIRE BENTSON .035X145CM (WIRE) IMPLANT
WIRE TORQFLEX AUST .018X40CM (WIRE) IMPLANT

## 2022-03-08 NOTE — Op Note (Signed)
Patient name: DAVION MEARA MRN: [id number] DOB: June 10, 1935 Sex: female  03/08/2022 Pre-operative Diagnosis: Right lower extremity lifestyle limiting claudication Post-operative diagnosis:  Same Surgeon:  [name], MD Procedure Performed: 1.  Ultrasound-guided micropuncture access of the left common femoral artery 2.  Aortogram 3.  First-order cannulation, right lower extremity angiogram 4.  Right external iliac artery stenting 8 x 20 mm Innova self-expanding postdilated with a 7 x 20 mm balloon 5.  Manual pressure for arteriotomy management 6.  Moderate sedation time 63 minutes 7.  Contrast volume 140 mL   Indications: Patient is an 86 year old female that has been followed in our office for quite some time with bilateral lower extremity claudication.  She was last seen a few months ago noting some worsening in right lower extremity claudication.  At her last visit, 8 weeks later, she noted significant decline in her ability to ambulate, and some numbness appreciated in the right foot.  ABIs decreased significantly in the right lower extremity.  She had a monophasic signal in the foot.  After discussing the risk and benefits of right lower extremity angiography in an effort to define and improve distal perfusion to treat lifestyle limiting claudication, Ellie elected to proceed.  Findings:  Aortogram: Bilateral renal arteries patent, no flow-limiting stenosis in the infrarenal abdominal aorta. The right common iliac was widely patent, hypogastric artery patent, external iliac demonstrated a focal 80% stenosis.  There was disease in the right common femoral artery. The left common iliac artery demonstrated 70% stenosis, hypogastric artery with ostial stenosis.  The external iliac artery demonstrated disease distally extending into the left common femoral artery.  In the right leg: Non-- flow-limiting atherosclerotic disease in the common femoral artery.  Profunda widely patent,  the superficial femoral artery had severe disease occluding prior to Hunter's canal with a large collateral reconstituting the P1 segment of the popliteal artery.  The anterior tibial artery and peroneal artery provided runoff to the level of the ankle.  The foot was not imaged due to constraints in the Cath Lab.   Procedure:  The patient was identified in the holding area and taken to room 8.  The patient was then placed supine on the table and prepped and draped in the usual sterile fashion.  A time out was called.  Ultrasound was used to evaluate the left common femoral artery.  It was patent .  A digital ultrasound image was acquired.  A micropuncture needle was used to access the left common femoral artery under ultrasound guidance.  An 018 wire was advanced without resistance and a micropuncture sheath was placed.  The 018 wire was removed and a benson wire was placed.  The micropuncture sheath was exchanged for a 5 french sheath.  An omniflush catheter was advanced over the wire to the level of L-1.  An abdominal angiogram was obtained.  Next, using the omniflush catheter and a benson wire, the aortic bifurcation was crossed and the catheter was placed into the right common iliac artery and right runoff was obtained.   I elected to interrogate the right-sided external iliac artery lesion further.  A pullback pressure demonstrated a gradient of 60 mmHg.  After seeing this, I chose to attempt intervention on the external iliac artery which demonstrated critical stenosis.  I chose to defer more distal revascularization as the patient's complaint was lifestyle limiting claudication.  A wire and catheter were used to navigate through the external iliac artery lesion.  A wire was  then driven into the profunda.  Following this, a 6 x 45 mm sheath was parked immediately proximal to the right-sided external iliac artery lesion.  Patient was heparinized with 5000 units IV heparin. Multiple images were taken with a  variety of obliquities.  Using software in the control room, the lesion was measured and post-stenotic dilatation had a diameter of 7 mm.  Being that the lesion was in the external iliac artery I chose to use an 8 mm self-expanding stent.  The [employer] would have required an increase to 7 [place], while the [employer] was compatible with the 6 [place] sheath that was already in place.  I did not want to increase the [place] size to 7 [place] due to severe disease in the common femoral artery and adventitial hematoma at the time of access, and therefore elected to use the [facility] [employer] 8 x 20 mm Innova stent.  The stent was brought onto the field and prepped..  It was laid across the left-sided external iliac artery lesion and deployed.  A 7 x 20 mm balloon was used for angioplasty.  There was a large, eccentric calcification behind the stent that limited full expansion of the stent, even with the balloon expanded to an atmosphere of 12.  Follow-up angiography demonstrated an improved result with less than 20% residual stenosis.  I was worried further balloon angioplasty could lead to rupture in the external iliac artery.  There appeared to be poor wall apposition distally in the area of poststenotic dilatation.  I considered extending the stent, however felt further intervention would not significantly improve the lesion.  At this point, I elected to terminate the case.  Regarding the left-sided common iliac artery lesion, the patient does not have symptoms in the left leg, therefore I elected not to treat the lesion.  Impression: Successful stenting of a right-sided focal external iliac artery lesion using an 8 x 20 mm stent with resolution of flow-limiting stenosis.  Residual stenosis less than 20%.  Poor apposition distally due to poststenotic dilatation.  Should the patient require future intervention, I would plan to use a covered stent across this lesion and extend versus shock wave  therapy.  I chose not to do so today as this would have required a larger [place] size and access was suboptimal.    Cassandria Santee, MD [facility]: [phone]

## 2022-03-08 NOTE — Progress Notes (Addendum)
SITE AREA: left groin/femoral  SITE PRIOR TO REMOVAL:  LEVEL 0  PRESSURE APPLIED FOR: approximately 35 minutes  MANUAL: yes  PATIENT STATUS DURING PULL: stable, pain noted, see MAR, oozing from site present pre sheath removal  POST PULL SITE:  LEVEL 1 (bruising)  POST PULL INSTRUCTIONS GIVEN: yes  POST PULL PULSES PRESENT: left pedal pulse at +1, right pedal pulse dopplerable  DRESSING APPLIED: gauze with tegaderm   BEDREST BEGINS @ 1550  COMMENTS:  pt with c/o pain to left groin, informed to stop moving hips and legs during sheath removal and to take slow deep breaths, 2nd RN to bedside Chilton Si) for assistance, see MAR for pain med given, Dr. Virl Cagey to bedside and helped with holding manual pressure, epi with lidocaine injected around Left groin insertion site by Dr. Virl Cagey, bruising noted around insertion site, suture removed before sheath removed, Purewick placed by Chilton Si, RN during sheath removal process

## 2022-03-08 NOTE — Progress Notes (Signed)
Pt to PSS via bed-pure wick in place

## 2022-03-08 NOTE — H&P (Signed)
Office Note   Patient seen and examined in preop holding.  No complaints. No changes to medication history or physical exam since last seen in clinic. After discussing the risks and benefits of RLE angiography to define and treat possible lesions leading to lifestyle limiting claudication, [name] elected to proceed.   [name] MD   CC:  follow up Requesting Provider:  No ref. provider found  HPI: Christie Peters is a 86 y.o. ([birth year]) female who presents for earlier follow up at the request of her PCP for worsening RLE claudication. She was recently seen at the end of August.  She has long history of RLE claudication but this has not been disabling. At her recent visit her symptoms were becoming worse and beginning to impact her lifestyle.  She also reported some inconsistent, periodic pain in her right foot at night however there is no regular pattern to this. Matt PA-C discussed the next step being angiography.  After discussing the procedure including risks, patient was hesitant to proceed.  For the time being she elected to continue her walking program.   Today she reports that she is having numbness and tingling in her right foot now. She is having "just a pain" in her right foot at night and also cramping/ throbbing like pain in her calf that is occurring at night and also on ambulation. The pain that is waking her up is not every night but frequently. This has worsened since her last visit about 6 weeks ago. She can walk about 10 minutes before she has to stop and rest. She explains that she does not do a lot of walking but does walk around her apartment all day. She does not have any tissue loss.   The pt is on a statin for cholesterol management.  The pt is on a daily aspirin.   Other AC:  none The pt is on ACE, HCTZ for hypertension.   The pt is not diabetic.  Tobacco hx:  Former  Past Medical History:  Diagnosis Date   Dental crowns present    Dupuytren's  contracture of left hand 09/2014   left middle finger   Hyperlipidemia    Hypertension    states under control with meds., has been on med. x 2 yr.    Past Surgical History:  Procedure Laterality Date   BLEPHAROPLASTY Bilateral 02/17/2013   upper lid   CATARACT EXTRACTION W/ INTRAOCULAR LENS  IMPLANT, BILATERAL     CESAREAN SECTION     COLONOSCOPY WITH PROPOFOL  03/21/2013   FASCIOTOMY Left 10/01/2014   Procedure: FASCIOTOMY LEFT MIDDLE FINGER, FASCIOTOMY LEFT RING FINGER,  FASCIOTOMY LEFT SMALL FINGER ;  Surgeon: [name], MD;  Location: [facility];  Service: Orthopedics;  Laterality: Left;    Social History   Socioeconomic History   Marital status: Married    Spouse name: Not on file   Number of children: 3   Years of education: Not on file   Highest education level: Not on file  Occupational History   Occupation: retired   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  Tobacco Use   Smoking status: Former    Packs/day: 0.50    Years: 60.00    Total pack years: 30.00    Types: Cigarettes    Quit date: 09/18/2013    Years since quitting: 8.4   Smokeless tobacco: Never   Tobacco comments:    pt smoked off and on for about 60 years,  reports stopping several times and then starting back  Vaping Use   Vaping Use: Never used  Substance and Sexual Activity   Alcohol use: Yes    Comment: occasionally   Drug use: No   Sexual activity: Not on file  Other Topics Concern   Not on file  Social History Narrative   4 children, one is decreased   Original from [place]   Exercise- no   Diet- she is careful w/ diet   Will spend 2 months in [place] this winter    Social Determinants of Health   Financial Resource Strain: Not on file  Food Insecurity: Not on file  Transportation Needs: Not on file  Physical Activity: Not on file  Stress: Not on file  Social Connections: Not on file  Intimate Partner Violence: Not on file    Family History  Problem Relation Age  of Onset   Stroke Mother    Hypertension Father    Breast cancer Sister    Birth defects Sister    Diabetes Brother    Heart disease Brother        CABG    Current Facility-Administered Medications  Medication Dose Route Frequency Provider Last Rate Last Admin   0.9 %  sodium chloride infusion   Intravenous Continuous [name], MD 100 mL/hr at 03/08/22 1139 New Bag at 03/08/22 1139    Allergies  Allergen Reactions   Levofloxacin Itching and Rash     REVIEW OF SYSTEMS:   '[X]'$  denotes positive finding, '[ ]'$  denotes negative finding Cardiac  Comments:  Chest pain or chest pressure:    Shortness of breath upon exertion:    Short of breath when lying flat:    Irregular heart rhythm:        Vascular    Pain in calf, thigh, or hip brought on by ambulation:    Pain in feet at night that wakes you up from your sleep:     Blood clot in your veins:    Leg swelling:         Pulmonary    Oxygen at home:    Productive cough:     Wheezing:         Neurologic    Sudden weakness in arms or legs:     Sudden numbness in arms or legs:     Sudden onset of difficulty speaking or slurred speech:    Temporary loss of vision in one eye:     Problems with dizziness:         Gastrointestinal    Blood in stool:     Vomited blood:         Genitourinary    Burning when urinating:     Blood in urine:        Psychiatric    Major depression:         Hematologic    Bleeding problems:    Problems with blood clotting too easily:        Skin    Rashes or ulcers:        Constitutional    Fever or chills:      PHYSICAL EXAMINATION:  Vitals:   03/08/22 1054  BP: (!) 162/80  Pulse: 69  Resp: 16  Temp: 97.8 F (36.6 C)  TempSrc: Temporal  SpO2: 97%  Weight: 59 kg  Height: '5\' 6"'$  (1.676 m)    General:  WDWN in NAD; vital signs documented above Gait: Normal HENT: WNL, normocephalic  Pulmonary: normal non-labored breathing , without wheezing Cardiac: regular HR,  without  Murmurs without carotid bruit Abdomen: soft, NT, no masses Vascular Exam/Pulses:  Right Left  Radial 2+ (normal) 2+ (normal)  Femoral 2+ (normal) 2+ (normal)  Popliteal Not palpable Not palpable  DP absent 1+ (weak)  PT absent absent   Extremities: without ischemic changes, without Gangrene , without cellulitis; without open wounds; feet are warm and well perfused. Varicose veins BLE and reticular veins bilaterally Musculoskeletal: no muscle wasting or atrophy  Neurologic: A&O X 3;  No focal weakness or paresthesias are detected Psychiatric:  The pt has Normal affect.   Non-Invasive Vascular Imaging:   +-------+-----------+-----------+------------+------------+  ABI/TBIToday's ABIToday's TBIPrevious ABIPrevious TBI  +-------+-----------+-----------+------------+------------+  Right  0.35       0.29       0.53        0.46          +-------+-----------+-----------+------------+------------+  Left   0.64       0.33       0.59        0.38          +-------+-----------+-----------+------------+------------+    ASSESSMENT/PLAN:: 86 y.o. female here for follow up for worsening claudication and what sounds like rest pain in right lower extremity. She was seen just 6 weeks ago and  her symptoms have progressed since then. Her ABI has decreased from her prior study on the right. Was 0.53 last time and now 0.35 cm. Initially she was hesitant to proceed with Angiogram but now that her symptoms have worsened she is agreeable to proceed as her symptoms are now disabling. I re discussed risks/ benefits/ alternatives of Angiogram with the patient and answered her question. She requested to meet Dr. Virl Cagey, who will perform her Angiogram. He also reiterated risks/benefits of the Angiogram and also possibility that if no endovascular options are available they would discuss afterwards any possible surgical intervention. We will work on scheduling the Angiogram in the near future  with Dr. Virl Cagey.     [name], PA-C [facility] [phone]  Clinic MD:   Virl Cagey

## 2022-03-08 NOTE — Progress Notes (Signed)
Pt ambulated with assist and w/c-states she does use walker prn-to BR/voided-assisted with redress and d/c home with daughter-NAD-no changes to left groin site

## 2022-03-08 NOTE — Progress Notes (Signed)
Pt with episode of hypotension-pt cont'd A/O-denies c/o- no change in left femoral site/no bleeding or hematoma-pt's color WNL/skin warm/dry-Dr Virl Cagey notified-orders for NS 500cc bolus-plan to proceed with d/c if pt stable and to call prn

## 2022-03-09 ENCOUNTER — Encounter (HOSPITAL_COMMUNITY): Payer: Self-pay | Admitting: Vascular Surgery

## 2022-03-10 MED FILL — Clopidogrel Bisulfate Tab 75 MG (Base Equiv): ORAL | Qty: 2 | Status: AC

## 2022-04-03 ENCOUNTER — Other Ambulatory Visit: Payer: Self-pay | Admitting: *Deleted

## 2022-04-03 DIAGNOSIS — I739 Peripheral vascular disease, unspecified: Secondary | ICD-10-CM

## 2022-04-07 ENCOUNTER — Ambulatory Visit (HOSPITAL_COMMUNITY)
Admission: RE | Admit: 2022-04-07 | Discharge: 2022-04-07 | Disposition: A | Discharge status: Home / Self Care | Payer: Medicare HMO | Source: Ambulatory Visit | Attending: Vascular Surgery | Admitting: Vascular Surgery

## 2022-04-07 ENCOUNTER — Ambulatory Visit: Payer: Medicare HMO

## 2022-04-07 ENCOUNTER — Encounter (HOSPITAL_COMMUNITY): Payer: Medicare HMO

## 2022-04-07 ENCOUNTER — Ambulatory Visit (INDEPENDENT_AMBULATORY_CARE_PROVIDER_SITE_OTHER): Payer: Medicare HMO | Admitting: Physician Assistant

## 2022-04-07 VITALS — BP 171/108 | HR 93 | Temp 98.0°F | Resp 20 | Ht 66.0 in | Wt 130.0 lb

## 2022-04-07 DIAGNOSIS — I739 Peripheral vascular disease, unspecified: Secondary | ICD-10-CM

## 2022-04-07 NOTE — Progress Notes (Signed)
[facility] [place] HISTORY AND PHYSICAL   History of Present Illness:  Patient is a 86 y.o. year old female who presents for evaluation of right LE claudication. She has a long history of claudication that progressed to life limiting.  She was scheduled for angiogram with possible intervention.   S/P  Right external iliac artery stenting 8 x 20 mm Innova self-expanding postdilated with a 7 x 20 mm balloon.  She is here today for follow.  She states she can walk much better and is not have any pain.    She is medically managed on ASA, Plavix and daily Statin.     Past Medical History:  Diagnosis Date   Dental crowns present    Dupuytren's contracture of left hand 09/2014   left middle finger   Hyperlipidemia    Hypertension    states under control with meds., has been on med. x 2 yr.    Past Surgical History:  Procedure Laterality Date   ABDOMINAL AORTOGRAM W/LOWER EXTREMITY N/A 03/08/2022   Procedure: ABDOMINAL AORTOGRAM W/LOWER EXTREMITY;  Surgeon: [name], MD;  Location: [facility];  Service: Cardiovascular;  Laterality: N/A;   BLEPHAROPLASTY Bilateral 02/17/2013   upper lid   CATARACT EXTRACTION W/ INTRAOCULAR LENS  IMPLANT, BILATERAL     CESAREAN SECTION     COLONOSCOPY WITH PROPOFOL  03/21/2013   FASCIOTOMY Left 10/01/2014   Procedure: FASCIOTOMY LEFT MIDDLE FINGER, FASCIOTOMY LEFT RING FINGER,  FASCIOTOMY LEFT SMALL FINGER ;  Surgeon: [name], MD;  Location: [facility];  Service: Orthopedics;  Laterality: Left;   PERIPHERAL VASCULAR INTERVENTION Right 03/08/2022   Procedure: PERIPHERAL VASCULAR INTERVENTION;  Surgeon: [name], MD;  Location: [facility];  Service: Cardiovascular;  Laterality: Right;    ROS:   General:  No weight loss, Fever, chills  HEENT: No recent headaches, no nasal bleeding, no visual changes, no sore throat  Neurologic: No dizziness, blackouts, seizures. No recent symptoms of  stroke or mini- stroke. No recent episodes of slurred speech, or temporary blindness.  Cardiac: No recent episodes of chest pain/pressure, no shortness of breath at rest.  No shortness of breath with exertion.  Denies history of atrial fibrillation or irregular heartbeat  Vascular: No history of rest pain in feet.  No history of claudication.  No history of non-healing ulcer, No history of DVT   Pulmonary: No home oxygen, no productive cough, no hemoptysis,  No asthma or wheezing  Musculoskeletal:  '[ ]'$  Arthritis, '[ ]'$  Low back pain,  '[ ]'$  Joint pain  Hematologic:No history of hypercoagulable state.  No history of easy bleeding.  No history of anemia  Gastrointestinal: No hematochezia or melena,  No gastroesophageal reflux, no trouble swallowing  Urinary: '[ ]'$  chronic Kidney disease, '[ ]'$  on HD - '[ ]'$  MWF or '[ ]'$  TTHS, '[ ]'$  Burning with urination, '[ ]'$  Frequent urination, '[ ]'$  Difficulty urinating;   Skin: No rashes  Psychological: No history of anxiety,  No history of depression  Social History Social History   Tobacco Use   Smoking status: Former    Packs/day: 0.50    Years: 60.00    Total pack years: 30.00    Types: Cigarettes    Quit date: 09/18/2013    Years since quitting: 8.5   Smokeless tobacco: Never   Tobacco comments:    pt smoked off and on for about 60 years, reports stopping several times and then starting  back  Vaping Use   Vaping Use: Never used  Substance Use Topics   Alcohol use: Yes    Comment: occasionally   Drug use: No    Family History Family History  Problem Relation Age of Onset   Stroke Mother    Hypertension Father    Breast cancer Sister    Birth defects Sister    Diabetes Brother    Heart disease Brother        CABG    Allergies  Allergies  Allergen Reactions   Levofloxacin Itching and Rash   Ciprofloxacin Hcl     Other Reaction(s): avoid use with thoracic aneurysm     Current Outpatient Medications  Medication Sig Dispense Refill    aspirin EC 81 MG tablet Take 1 tablet (81 mg total) by mouth daily. Swallow whole. 90 tablet 3   benazepril (LOTENSIN) 20 MG tablet Take 20 mg by mouth daily.     Calcium Carbonate-Vitamin D (CALTRATE 600+D PO) Take by mouth.     clopidogrel (PLAVIX) 75 MG tablet Take 1 tablet (75 mg total) by mouth daily. 30 tablet 11   Fluticasone-Umeclidin-Vilant (TRELEGY ELLIPTA IN) Inhale 1 puff into the lungs daily.     hydrochlorothiazide (HYDRODIURIL) 25 MG tablet TAKE ONE TABLET BY MOUTH EVERY DAY 30 tablet 0   pravastatin (PRAVACHOL) 10 MG tablet Take 10 mg by mouth at bedtime.     Propylene Glycol 0.95 % SOLN Place 1 drop into both eyes daily.     No current facility-administered medications for this visit.    Physical Examination  Vitals:   04/07/22 1419  BP: (!) 171/108  Pulse: 93  Resp: 20  Temp: 98 F (36.7 C)  TempSrc: Temporal  SpO2: 96%  Weight: 130 lb (59 kg)  Height: '5\' 6"'$  (1.676 m)    Body mass index is 20.98 kg/m.  General:  Alert and oriented, no acute distress HEENT: Normal Neck: No bruit or JVD Pulmonary: Clear to auscultation bilaterally Cardiac: Regular Rate and Rhythm without murmur Abdomen: Soft, non-tender, non-distended, no mass, no scars Skin: No rash Extremity Pulses:  palpable DP right LE, skin without ischemic changes. Musculoskeletal: No deformity or edema  Neurologic: Upper and lower extremity motor 5/5 and symmetric  DATA:  ABI Findings:  +---------+------------------+-----+----------+--------+  Right   Rt Pressure (mmHg)IndexWaveform  Comment   +---------+------------------+-----+----------+--------+  Brachial 164                                        +---------+------------------+-----+----------+--------+  PTA     95                0.58 monophasic          +---------+------------------+-----+----------+--------+  DP      105               0.64 monophasic           +---------+------------------+-----+----------+--------+  Great Toe82                0.50                     +---------+------------------+-----+----------+--------+   +---------+------------------+-----+----------+-------+  Left    Lt Pressure (mmHg)IndexWaveform  Comment  +---------+------------------+-----+----------+-------+  Brachial 154                                       +---------+------------------+-----+----------+-------+  PTA     85                0.52 monophasic         +---------+------------------+-----+----------+-------+  DP      99                0.60 monophasic         +---------+------------------+-----+----------+-------+  Great Toe74                0.45                    +---------+------------------+-----+----------+-------+   +-------+-----------+-----------+------------+------------+  ABI/TBIToday's ABIToday's TBIPrevious ABIPrevious TBI  +-------+-----------+-----------+------------+------------+  Right 0.64       0.50       0.35        0.29          +-------+-----------+-----------+------------+------------+  Left  0.60       0.45       0.64        0.33          +-------+-----------+-----------+------------+------------+       Right ABIs and TBIs appear increased compared to prior study on 02/24/22.  Left ABIs and TBIs appear essentially unchanged compared to prior study on  02/24/22.    Summary:  Right: Resting right ankle-brachial index indicates moderate right lower  extremity arterial disease. The right toe-brachial index is abnormal.   Left: Resting left ankle-brachial index indicates moderate left lower  extremity arterial disease. The left toe-brachial index is abnormal.     ASSESSMENT/PLAN:  PAD wit history of worsening claudication symptoms on the right LE She is pleased with her outcome and has no residual claudication symptoms.  Her ABI's have improved.  She has PAD in the left  LE as well without symptoms of ischemia.  She will stay active, continue ASA and Plavix daily.  I will have her come back in 9 months for repeat surveillance studies.    Should the patient require future intervention, Dr. Virl Cagey would plan to use a covered stent across this lesion and extend versus shock wave therapy.      Roxy Horseman PA-C [facility] [place] Office: [phone]  MD in clinic [place]

## 2022-04-10 ENCOUNTER — Other Ambulatory Visit: Payer: Self-pay

## 2022-04-10 DIAGNOSIS — I739 Peripheral vascular disease, unspecified: Secondary | ICD-10-CM

## 2022-05-18 DIAGNOSIS — D099 Carcinoma in situ, unspecified: Secondary | ICD-10-CM | POA: Diagnosis not present

## 2022-07-03 DIAGNOSIS — M810 Age-related osteoporosis without current pathological fracture: Secondary | ICD-10-CM | POA: Diagnosis not present

## 2022-07-03 DIAGNOSIS — R7989 Other specified abnormal findings of blood chemistry: Secondary | ICD-10-CM | POA: Diagnosis not present

## 2022-07-03 DIAGNOSIS — I700 Atherosclerosis of aorta: Secondary | ICD-10-CM | POA: Diagnosis not present

## 2022-07-03 DIAGNOSIS — E785 Hyperlipidemia, unspecified: Secondary | ICD-10-CM | POA: Diagnosis not present

## 2022-07-03 DIAGNOSIS — I10 Essential (primary) hypertension: Secondary | ICD-10-CM | POA: Diagnosis not present

## 2022-07-11 DIAGNOSIS — K5909 Other constipation: Secondary | ICD-10-CM | POA: Diagnosis not present

## 2022-07-11 DIAGNOSIS — J449 Chronic obstructive pulmonary disease, unspecified: Secondary | ICD-10-CM | POA: Diagnosis not present

## 2022-07-11 DIAGNOSIS — I10 Essential (primary) hypertension: Secondary | ICD-10-CM | POA: Diagnosis not present

## 2022-07-11 DIAGNOSIS — M545 Low back pain, unspecified: Secondary | ICD-10-CM | POA: Diagnosis not present

## 2022-07-11 DIAGNOSIS — R82998 Other abnormal findings in urine: Secondary | ICD-10-CM | POA: Diagnosis not present

## 2022-07-11 DIAGNOSIS — I739 Peripheral vascular disease, unspecified: Secondary | ICD-10-CM | POA: Diagnosis not present

## 2022-07-11 DIAGNOSIS — Z0000 Encounter for general adult medical examination without abnormal findings: Secondary | ICD-10-CM | POA: Diagnosis not present

## 2022-07-11 DIAGNOSIS — E785 Hyperlipidemia, unspecified: Secondary | ICD-10-CM | POA: Diagnosis not present

## 2022-07-11 DIAGNOSIS — Z1339 Encounter for screening examination for other mental health and behavioral disorders: Secondary | ICD-10-CM | POA: Diagnosis not present

## 2022-07-11 DIAGNOSIS — Z1331 Encounter for screening for depression: Secondary | ICD-10-CM | POA: Diagnosis not present

## 2022-07-11 DIAGNOSIS — M810 Age-related osteoporosis without current pathological fracture: Secondary | ICD-10-CM | POA: Diagnosis not present

## 2022-07-11 DIAGNOSIS — I700 Atherosclerosis of aorta: Secondary | ICD-10-CM | POA: Diagnosis not present

## 2022-07-19 DIAGNOSIS — R0981 Nasal congestion: Secondary | ICD-10-CM | POA: Diagnosis not present

## 2022-07-19 DIAGNOSIS — R051 Acute cough: Secondary | ICD-10-CM | POA: Diagnosis not present

## 2022-07-19 DIAGNOSIS — Z1152 Encounter for screening for COVID-19: Secondary | ICD-10-CM | POA: Diagnosis not present

## 2022-07-19 DIAGNOSIS — J189 Pneumonia, unspecified organism: Secondary | ICD-10-CM | POA: Diagnosis not present

## 2022-07-19 DIAGNOSIS — R5383 Other fatigue: Secondary | ICD-10-CM | POA: Diagnosis not present

## 2022-07-19 DIAGNOSIS — J449 Chronic obstructive pulmonary disease, unspecified: Secondary | ICD-10-CM | POA: Diagnosis not present

## 2022-07-25 DIAGNOSIS — J449 Chronic obstructive pulmonary disease, unspecified: Secondary | ICD-10-CM | POA: Diagnosis not present

## 2022-07-25 DIAGNOSIS — E871 Hypo-osmolality and hyponatremia: Secondary | ICD-10-CM | POA: Diagnosis not present

## 2022-07-25 DIAGNOSIS — J189 Pneumonia, unspecified organism: Secondary | ICD-10-CM | POA: Diagnosis not present

## 2022-07-25 DIAGNOSIS — R531 Weakness: Secondary | ICD-10-CM | POA: Diagnosis not present

## 2022-08-07 DIAGNOSIS — J189 Pneumonia, unspecified organism: Secondary | ICD-10-CM | POA: Diagnosis not present

## 2022-08-11 DIAGNOSIS — Z961 Presence of intraocular lens: Secondary | ICD-10-CM | POA: Diagnosis not present

## 2022-08-11 DIAGNOSIS — H52203 Unspecified astigmatism, bilateral: Secondary | ICD-10-CM | POA: Diagnosis not present

## 2022-08-11 DIAGNOSIS — H1789 Other corneal scars and opacities: Secondary | ICD-10-CM | POA: Diagnosis not present

## 2022-08-11 DIAGNOSIS — H35373 Puckering of macula, bilateral: Secondary | ICD-10-CM | POA: Diagnosis not present

## 2022-08-21 ENCOUNTER — Ambulatory Visit: Payer: Medicare HMO | Admitting: Sports Medicine

## 2022-08-21 NOTE — Progress Notes (Unsigned)
    Christie Peters D.[place] Phone: [phone]   Assessment and Plan:     There are no diagnoses linked to this encounter.  ***   Pertinent previous records reviewed include ***   Follow Up: ***     Subjective:   I, Christie Peters, am serving as a Education administrator for Doctor Glennon Mac  Chief Complaint: neck pain   HPI:   08/22/2022 Patient is a 87 year old female complaining of neck pain. Patient states  Relevant Historical Information: ***  Additional pertinent review of systems negative.   Current Outpatient Medications:    aspirin EC 81 MG tablet, Take 1 tablet (81 mg total) by mouth daily. Swallow whole., Disp: 90 tablet, Rfl: 3   benazepril (LOTENSIN) 20 MG tablet, Take 20 mg by mouth daily., Disp: , Rfl:    Calcium Carbonate-Vitamin D (CALTRATE 600+D PO), Take by mouth., Disp: , Rfl:    clopidogrel (PLAVIX) 75 MG tablet, Take 1 tablet (75 mg total) by mouth daily., Disp: 30 tablet, Rfl: 11   Fluticasone-Umeclidin-Vilant (TRELEGY ELLIPTA IN), Inhale 1 puff into the lungs daily., Disp: , Rfl:    hydrochlorothiazide (HYDRODIURIL) 25 MG tablet, TAKE ONE TABLET BY MOUTH EVERY DAY, Disp: 30 tablet, Rfl: 0   pravastatin (PRAVACHOL) 10 MG tablet, Take 10 mg by mouth at bedtime., Disp: , Rfl:    Propylene Glycol 0.95 % SOLN, Place 1 drop into both eyes daily., Disp: , Rfl:    Objective:     There were no vitals filed for this visit.    There is no height or weight on file to calculate BMI.    Physical Exam:    ***   Electronically signed by:  Christie Peters D.[facility] 7:26 AM 08/21/22

## 2022-08-22 ENCOUNTER — Ambulatory Visit (INDEPENDENT_AMBULATORY_CARE_PROVIDER_SITE_OTHER): Payer: Medicare HMO

## 2022-08-22 ENCOUNTER — Ambulatory Visit: Payer: Medicare HMO | Admitting: Sports Medicine

## 2022-08-22 VITALS — BP 120/84 | HR 94 | Ht 66.0 in | Wt 130.0 lb

## 2022-08-22 DIAGNOSIS — M542 Cervicalgia: Secondary | ICD-10-CM

## 2022-08-22 DIAGNOSIS — M5030 Other cervical disc degeneration, unspecified cervical region: Secondary | ICD-10-CM

## 2022-08-22 NOTE — Patient Instructions (Addendum)
Good to see you  Tylenol 650 mg 3x a day as needed  Can use aleve for breakthrough pain . Limit to 1-2 times a week only  Neck HEP  As needed follow up

## 2022-08-31 DIAGNOSIS — H35372 Puckering of macula, left eye: Secondary | ICD-10-CM | POA: Diagnosis not present

## 2022-08-31 DIAGNOSIS — H43813 Vitreous degeneration, bilateral: Secondary | ICD-10-CM | POA: Diagnosis not present

## 2022-09-05 DIAGNOSIS — M810 Age-related osteoporosis without current pathological fracture: Secondary | ICD-10-CM | POA: Diagnosis not present

## 2022-09-06 DIAGNOSIS — J189 Pneumonia, unspecified organism: Secondary | ICD-10-CM | POA: Diagnosis not present

## 2022-09-06 DIAGNOSIS — J449 Chronic obstructive pulmonary disease, unspecified: Secondary | ICD-10-CM | POA: Diagnosis not present

## 2022-10-06 DIAGNOSIS — D485 Neoplasm of uncertain behavior of skin: Secondary | ICD-10-CM | POA: Diagnosis not present

## 2022-10-06 DIAGNOSIS — C44719 Basal cell carcinoma of skin of left lower limb, including hip: Secondary | ICD-10-CM | POA: Diagnosis not present

## 2022-10-25 DIAGNOSIS — C44719 Basal cell carcinoma of skin of left lower limb, including hip: Secondary | ICD-10-CM | POA: Diagnosis not present

## 2022-11-10 DIAGNOSIS — I10 Essential (primary) hypertension: Secondary | ICD-10-CM | POA: Diagnosis not present

## 2022-11-10 DIAGNOSIS — R2681 Unsteadiness on feet: Secondary | ICD-10-CM | POA: Diagnosis not present

## 2022-11-10 DIAGNOSIS — L089 Local infection of the skin and subcutaneous tissue, unspecified: Secondary | ICD-10-CM | POA: Diagnosis not present

## 2022-11-10 DIAGNOSIS — W010XXA Fall on same level from slipping, tripping and stumbling without subsequent striking against object, initial encounter: Secondary | ICD-10-CM | POA: Diagnosis not present

## 2022-11-10 DIAGNOSIS — T148XXA Other injury of unspecified body region, initial encounter: Secondary | ICD-10-CM | POA: Diagnosis not present

## 2022-12-19 DIAGNOSIS — C44719 Basal cell carcinoma of skin of left lower limb, including hip: Secondary | ICD-10-CM | POA: Diagnosis not present

## 2023-01-02 ENCOUNTER — Ambulatory Visit: Payer: Medicare HMO

## 2023-01-02 ENCOUNTER — Encounter (HOSPITAL_COMMUNITY): Payer: Medicare HMO

## 2023-01-25 ENCOUNTER — Ambulatory Visit: Payer: Medicare HMO | Admitting: Physician Assistant

## 2023-01-25 ENCOUNTER — Ambulatory Visit (HOSPITAL_COMMUNITY)
Admission: RE | Admit: 2023-01-25 | Discharge: 2023-01-25 | Disposition: A | Discharge status: Home / Self Care | Payer: Medicare HMO | Source: Ambulatory Visit | Attending: Vascular Surgery | Admitting: Vascular Surgery

## 2023-01-25 VITALS — BP 158/83 | HR 83 | Temp 98.0°F | Resp 18 | Ht 66.0 in | Wt 131.4 lb

## 2023-01-25 DIAGNOSIS — I771 Stricture of artery: Secondary | ICD-10-CM

## 2023-01-25 DIAGNOSIS — I70211 Atherosclerosis of native arteries of extremities with intermittent claudication, right leg: Secondary | ICD-10-CM | POA: Diagnosis not present

## 2023-01-25 DIAGNOSIS — M7989 Other specified soft tissue disorders: Secondary | ICD-10-CM

## 2023-01-25 DIAGNOSIS — I739 Peripheral vascular disease, unspecified: Secondary | ICD-10-CM | POA: Diagnosis not present

## 2023-01-25 LAB — VAS US ABI WITH/WO TBI
Left ABI: 0.55
Right ABI: 0.65

## 2023-01-25 NOTE — Progress Notes (Signed)
Office Note     CC:  follow up Requesting Provider:  Garlan Fillers, MD  HPI: Christie Peters is a 87 y.o. ([birth year]) female who presents for follow up of PAD. She has history of RLE claudication. She recently underwent Aortogram, Arteriogram RLE with Right external iliac artery stenting on 03/08/22 by Dr. Karin Lieu. At her last visit in November she had resolution of her claudication.   Today she reports she is doing well. She does not have any pain on ambulation.  Her prior pain in the right leg remains resolved. She does report unsteady gait and balance. Denies any pain on rest or that wakes her up from sleep. No tissue loss. She does get occasional bleeding from her varicose veins and says sometimes when shaving she nicks one and being on blood thinners she bleeds very easily. She is medically managed on Aspirin, Statin and Plavix.  Her PCP also started her on Cilostazol. She would like to stop her Plavix due to easily bruising and bleeding.   Past Medical History:  Diagnosis Date   Dental crowns present    Dupuytren's contracture of left hand 09/2014   left middle finger   Hyperlipidemia    Hypertension    states under control with meds., has been on med. x 2 yr.    Past Surgical History:  Procedure Laterality Date   ABDOMINAL AORTOGRAM W/LOWER EXTREMITY N/A 03/08/2022   Procedure: ABDOMINAL AORTOGRAM W/LOWER EXTREMITY;  Surgeon: Victorino Sparrow, MD;  Location: [facility];  Service: Cardiovascular;  Laterality: N/A;   BLEPHAROPLASTY Bilateral 02/17/2013   upper lid   CATARACT EXTRACTION W/ INTRAOCULAR LENS  IMPLANT, BILATERAL     CESAREAN SECTION     COLONOSCOPY WITH PROPOFOL  03/21/2013   FASCIOTOMY Left 10/01/2014   Procedure: FASCIOTOMY LEFT MIDDLE FINGER, FASCIOTOMY LEFT RING FINGER,  FASCIOTOMY LEFT SMALL FINGER ;  Surgeon: Cindee Salt, MD;  Location: [facility];  Service: Orthopedics;  Laterality: Left;   PERIPHERAL VASCULAR INTERVENTION Right  03/08/2022   Procedure: PERIPHERAL VASCULAR INTERVENTION;  Surgeon: Victorino Sparrow, MD;  Location: [facility];  Service: Cardiovascular;  Laterality: Right;    Social History   Socioeconomic History   Marital status: Married    Spouse name: Not on file   Number of children: 3   Years of education: Not on file   Highest education level: Not on file  Occupational History   Occupation: retired   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  Tobacco Use   Smoking status: Former    Current packs/day: 0.00    Average packs/day: 0.5 packs/day for 60.0 years (30.0 ttl pk-yrs)    Types: Cigarettes    Start date: 09/18/1953    Quit date: 09/18/2013    Years since quitting: 9.3   Smokeless tobacco: Never   Tobacco comments:    pt smoked off and on for about 60 years, reports stopping several times and then starting back  Vaping Use   Vaping status: Never Used  Substance and Sexual Activity   Alcohol use: Yes    Comment: occasionally   Drug use: No   Sexual activity: Not on file  Other Topics Concern   Not on file  Social History Narrative   4 children, one is decreased   Original from [place]   Exercise- no   Diet- she is careful w/ diet   Will spend 2 months in [place] this winter    Social Determinants of Health  Financial Resource Strain: Not on file  Food Insecurity: Not on file  Transportation Needs: Not on file  Physical Activity: Not on file  Stress: Not on file  Social Connections: Not on file  Intimate Partner Violence: Not on file    Family History  Problem Relation Age of Onset   Stroke Mother    Hypertension Father    Breast cancer Sister    Birth defects Sister    Diabetes Brother    Heart disease Brother        CABG    Current Outpatient Medications  Medication Sig Dispense Refill   aspirin EC 81 MG tablet Take 1 tablet (81 mg total) by mouth daily. Swallow whole. 90 tablet 3   benazepril (LOTENSIN) 20 MG tablet Take 20 mg by mouth daily.      Calcium Carbonate-Vitamin D (CALTRATE 600+D PO) Take by mouth.     clopidogrel (PLAVIX) 75 MG tablet Take 1 tablet (75 mg total) by mouth daily. 30 tablet 11   Fluticasone-Umeclidin-Vilant (TRELEGY ELLIPTA IN) Inhale 1 puff into the lungs daily.     hydrochlorothiazide (HYDRODIURIL) 25 MG tablet TAKE ONE TABLET BY MOUTH EVERY DAY 30 tablet 0   pravastatin (PRAVACHOL) 10 MG tablet Take 10 mg by mouth at bedtime.     Propylene Glycol 0.95 % SOLN Place 1 drop into both eyes daily.     No current facility-administered medications for this visit.    Allergies  Allergen Reactions   Levofloxacin Itching and Rash   Ciprofloxacin Hcl     Other Reaction(s): avoid use with thoracic aneurysm     REVIEW OF SYSTEMS:  [X]  denotes positive finding, [ ]  denotes negative finding Cardiac  Comments:  Chest pain or chest pressure:    Shortness of breath upon exertion:    Short of breath when lying flat:    Irregular heart rhythm:        Vascular    Pain in calf, thigh, or hip brought on by ambulation:    Pain in feet at night that wakes you up from your sleep:     Blood clot in your veins:    Leg swelling:  X       Pulmonary    Oxygen at home:    Productive cough:     Wheezing:         Neurologic    Sudden weakness in arms or legs:     Sudden numbness in arms or legs:     Sudden onset of difficulty speaking or slurred speech:    Temporary loss of vision in one eye:     Problems with dizziness:         Gastrointestinal    Blood in stool:     Vomited blood:         Genitourinary    Burning when urinating:     Blood in urine:        Psychiatric    Major depression:         Hematologic    Bleeding problems:    Problems with blood clotting too easily:        Skin    Rashes or ulcers:        Constitutional    Fever or chills:      PHYSICAL EXAMINATION:  Vitals:   01/25/23 1332  BP: (!) 158/83  Pulse: 83  Resp: 18  Temp: 98 F (36.7 C)  TempSrc: Temporal  SpO2: 90%   Weight:  131 lb 6.4 oz (59.6 kg)  Height: 5\' 6"  (1.676 m)    General:  WDWN in NAD; vital signs documented above Gait: Normal HENT: WNL, normocephalic Pulmonary: normal non-labored breathing , without  wheezing Cardiac: regular HR Abdomen: soft Vascular Exam/Pulses: 2+ femoral, 2+ popliteal pulses bilaterally, no palpable distal pulses. Feet warm and well perfused Extremities: without ischemic changes, without Gangrene , without cellulitis; without open wounds; mild edema around both ankles, left greater than right. Numerous varicose and reticular veins on BLE Musculoskeletal: no muscle wasting or atrophy  Neurologic: A&O X 3 Psychiatric:  The pt has Normal affect.   Non-Invasive Vascular Imaging:   +-------+-----------+-----------+------------+------------+  ABI/TBIToday's ABIToday's TBIPrevious ABIPrevious TBI  +-------+-----------+-----------+------------+------------+  Right 0.65       0.46       0.64        0.50          +-------+-----------+-----------+------------+------------+  Left  0.55       0.38       0.64        0.33          +-------+-----------+-----------+------------+------------+    ASSESSMENT/PLAN:: 87 y.o. female here for follow up for PAD. She has history of RLE claudication. She recently underwent Aortogram, Arteriogram RLE with Right external iliac artery stenting on 03/08/22 by Dr. Karin Lieu. BLE well perfused and warm. She is without any claudication, rest pain or tissue loss. She does have some swelling in her legs and she has had some issues with bleeding varicose veins so I have recommended light compression stockings. She was measured for 15-20 mmHg knee high at today's visit - ABI today is essentially unchanged from prior study - encourage walking regimen  - Continue Aspirin, Statin and Pletal - Okay for her to discontinue Plavix - She will follow up in 6 months with Aorto iliac duplex and ABI   Graceann Congress, PA-C [facility] [phone]  Clinic MD:   Edilia Bo

## 2023-02-06 ENCOUNTER — Other Ambulatory Visit: Payer: Self-pay

## 2023-02-06 DIAGNOSIS — I70211 Atherosclerosis of native arteries of extremities with intermittent claudication, right leg: Secondary | ICD-10-CM

## 2023-02-06 DIAGNOSIS — I739 Peripheral vascular disease, unspecified: Secondary | ICD-10-CM

## 2023-03-19 ENCOUNTER — Other Ambulatory Visit: Payer: Self-pay | Admitting: Internal Medicine

## 2023-03-19 DIAGNOSIS — Z1231 Encounter for screening mammogram for malignant neoplasm of breast: Secondary | ICD-10-CM

## 2023-04-10 ENCOUNTER — Ambulatory Visit: Payer: Medicare HMO | Admitting: Podiatry

## 2023-04-16 ENCOUNTER — Ambulatory Visit
Admission: RE | Admit: 2023-04-16 | Discharge: 2023-04-16 | Disposition: A | Discharge status: Home / Self Care | Payer: Medicare HMO | Source: Ambulatory Visit | Attending: Internal Medicine | Admitting: Internal Medicine

## 2023-04-16 DIAGNOSIS — Z1231 Encounter for screening mammogram for malignant neoplasm of breast: Secondary | ICD-10-CM | POA: Diagnosis not present

## 2023-04-24 ENCOUNTER — Encounter: Payer: Self-pay | Admitting: Podiatry

## 2023-04-24 ENCOUNTER — Ambulatory Visit: Payer: Medicare HMO | Admitting: Podiatry

## 2023-04-24 DIAGNOSIS — L03031 Cellulitis of right toe: Secondary | ICD-10-CM

## 2023-04-24 MED ORDER — NEOMYCIN-POLYMYXIN-HC 1 % OT SOLN: OTIC | 1 refills | Status: DC

## 2023-04-24 NOTE — Progress Notes (Signed)
Christie Peters presents today chief complaint of a painful ingrown toenail to the tibial-fibular border of the right hallux.  Objective: Vital signs are stable alert oriented x 3.  Pulses are palpable.  There is erythema and edema sharply abraded nail margin no purulence no malodor.  Assessment: Pain limb secondary to ingrown toenail tibial-fibular border of the hallux right.  Plan: I&D was performed today along the tibial-fibular border of the hallux right just remove the margin.  Tolerated procedure well after local anesthetic was administered she is given both oral written home-going instructions for the care and soaking of the toe and I will follow-up with her in 2 to 3 weeks.

## 2023-04-24 NOTE — Patient Instructions (Signed)

## 2023-04-25 DIAGNOSIS — Z961 Presence of intraocular lens: Secondary | ICD-10-CM | POA: Diagnosis not present

## 2023-04-25 DIAGNOSIS — H18453 Nodular corneal degeneration, bilateral: Secondary | ICD-10-CM | POA: Diagnosis not present

## 2023-04-25 DIAGNOSIS — Z9889 Other specified postprocedural states: Secondary | ICD-10-CM | POA: Diagnosis not present

## 2023-05-08 ENCOUNTER — Ambulatory Visit: Payer: Medicare HMO | Admitting: Podiatry

## 2023-05-08 DIAGNOSIS — Z9889 Other specified postprocedural states: Secondary | ICD-10-CM

## 2023-05-08 DIAGNOSIS — L03031 Cellulitis of right toe: Secondary | ICD-10-CM

## 2023-05-08 NOTE — Progress Notes (Signed)
She presents today for follow-up of her matrixectomy of her right hallux.  She denies fever chills nausea vomit states that it hurt for a day or so but after that it did really well she is very happy with the outcome.  Objective: There is no erythema edema salines drainage odor small scab is still present with no tenderness on palpation.  Assessment: Well-healing surgical talofibular border hallux right status post matrixectomy.  Plan: Follow-up with her on an as-needed basis.

## 2023-06-15 ENCOUNTER — Ambulatory Visit (INDEPENDENT_AMBULATORY_CARE_PROVIDER_SITE_OTHER): Payer: PPO | Admitting: Family Medicine

## 2023-06-15 ENCOUNTER — Encounter: Payer: Self-pay | Admitting: Family Medicine

## 2023-06-15 ENCOUNTER — Ambulatory Visit (INDEPENDENT_AMBULATORY_CARE_PROVIDER_SITE_OTHER): Payer: PPO

## 2023-06-15 VITALS — BP 142/88 | HR 40 | Ht 66.0 in | Wt 134.0 lb

## 2023-06-15 DIAGNOSIS — M47814 Spondylosis without myelopathy or radiculopathy, thoracic region: Secondary | ICD-10-CM | POA: Diagnosis not present

## 2023-06-15 DIAGNOSIS — M419 Scoliosis, unspecified: Secondary | ICD-10-CM | POA: Diagnosis not present

## 2023-06-15 DIAGNOSIS — M8000XA Age-related osteoporosis with current pathological fracture, unspecified site, initial encounter for fracture: Secondary | ICD-10-CM

## 2023-06-15 DIAGNOSIS — S32030A Wedge compression fracture of third lumbar vertebra, initial encounter for closed fracture: Secondary | ICD-10-CM

## 2023-06-15 DIAGNOSIS — G8929 Other chronic pain: Secondary | ICD-10-CM

## 2023-06-15 DIAGNOSIS — M545 Low back pain, unspecified: Secondary | ICD-10-CM

## 2023-06-15 DIAGNOSIS — M4316 Spondylolisthesis, lumbar region: Secondary | ICD-10-CM | POA: Diagnosis not present

## 2023-06-15 DIAGNOSIS — M4856XA Collapsed vertebra, not elsewhere classified, lumbar region, initial encounter for fracture: Secondary | ICD-10-CM | POA: Diagnosis not present

## 2023-06-15 DIAGNOSIS — M549 Dorsalgia, unspecified: Secondary | ICD-10-CM | POA: Diagnosis not present

## 2023-06-15 MED ORDER — CALCITONIN (SALMON) 200 UNIT/ACT NA SOLN: 1.0000 | Freq: Every day | NASAL | 0 refills | Status: AC

## 2023-06-15 MED ORDER — HYDROCODONE-ACETAMINOPHEN 5-325 MG PO TABS: 1.0000 | ORAL_TABLET | Freq: Two times a day (BID) | ORAL | 0 refills | Status: DC | PRN

## 2023-06-15 NOTE — Patient Instructions (Addendum)
Please use Norco sparingly and do not take with Tramadol -Start with 1/2 for a tablet Calcitonin please read side effects CT scan of lumbar spine without contrast  We will be in touch

## 2023-06-15 NOTE — Assessment & Plan Note (Signed)
Concerned with patient's second fracture.  Highly recommend Prolia at this time

## 2023-06-15 NOTE — Assessment & Plan Note (Signed)
Appears to be in new onset compression fracture.  Patient has severe tenderness to palpation in the L3-L4 area.  Do feel that advanced imaging with CT scan is necessary to discuss the possibility of kyphoplasty with patient being in severe pain.  We discussed with patient about the possibility of calcitonin and warned the potential side effects and the importance of taking calcium.  Patient will do this.  In addition to this given a low dose of hydrocodone and warned of potential side effects.  Patient has been on tramadol previously but is on Celexa and would like to avoid any serotonin syndrome at the moment.  Patient will follow-up again in 2 weeks but we will discuss after imaging.

## 2023-06-15 NOTE — Progress Notes (Signed)
[facility] [address] [place] [address] Phone: [phone] Subjective:   Bruce Donath, am serving as a scribe for Dr. Antoine Primas.  I'm seeing this patient by the request  of:  Garlan Fillers, MD  CC: Back pain  [id number]  Christie Peters is a 88 y.o. female coming in with complaint of back pain. Last seen in April 2024 for cervical spine pain. Patient states that she was lifting a lamp on Sunday and is now having pain in lumbar. Pain does not radiate but is on both sides of lumbar spine. Painful to get up and down from chair. Pain is constant though.       Past Medical History:  Diagnosis Date   Dental crowns present    Dupuytren's contracture of left hand 09/2014   left middle finger   Hyperlipidemia    Hypertension    states under control with meds., has been on med. x 2 yr.   Past Surgical History:  Procedure Laterality Date   ABDOMINAL AORTOGRAM W/LOWER EXTREMITY N/A 03/08/2022   Procedure: ABDOMINAL AORTOGRAM W/LOWER EXTREMITY;  Surgeon: Victorino Sparrow, MD;  Location: [facility] INVASIVE CV LAB;  Service: Cardiovascular;  Laterality: N/A;   BLEPHAROPLASTY Bilateral 02/17/2013   upper lid   CATARACT EXTRACTION W/ INTRAOCULAR LENS  IMPLANT, BILATERAL     CESAREAN SECTION     COLONOSCOPY WITH PROPOFOL  03/21/2013   FASCIOTOMY Left 10/01/2014   Procedure: FASCIOTOMY LEFT MIDDLE FINGER, FASCIOTOMY LEFT RING FINGER,  FASCIOTOMY LEFT SMALL FINGER ;  Surgeon: Cindee Salt, MD;  [facility];  Service: Orthopedics;  Laterality: Left;   PERIPHERAL VASCULAR INTERVENTION Right 03/08/2022   Procedure: PERIPHERAL VASCULAR INTERVENTION;  Surgeon: Victorino Sparrow, MD;  Location: [facility] INVASIVE CV LAB;  Service: Cardiovascular;  Laterality: Right;   Social History   Socioeconomic History   Marital status: Married    Spouse name: Not on file   Number of children: 3   Years of education: Not on file   Highest  education level: Not on file  Occupational History   Occupation: retired   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  Tobacco Use   Smoking status: Former    Current packs/day: 0.00    Average packs/day: 0.5 packs/day for 60.0 years (30.0 ttl pk-yrs)    Types: Cigarettes    Start date: 09/18/1953    Quit date: 09/18/2013    Years since quitting: 9.7   Smokeless tobacco: Never   Tobacco comments:    pt smoked off and on for about 60 years, reports stopping several times and then starting back  Vaping Use   Vaping status: Never Used  Substance and Sexual Activity   Alcohol use: Yes    Comment: occasionally   Drug use: No   Sexual activity: Not on file  Other Topics Concern   Not on file  Social History Narrative   4 children, one is decreased   Original from [place]   Exercise- no   Diet- she is careful w/ diet   Will spend 2 months in [place] this winter    Social Drivers of Corporate investment banker Strain: Not on file  Food Insecurity: Not on file  Transportation Needs: Not on file  Physical Activity: Not on file  Stress: Not on file  Social Connections: Not on file   Allergies  Allergen Reactions   Levofloxacin Itching and Rash   Ciprofloxacin Hcl  Other Reaction(s): avoid use with thoracic aneurysm   Family History  Problem Relation Age of Onset   Stroke Mother    Hypertension Father    Breast cancer Sister    Birth defects Sister    Diabetes Brother    Heart disease Brother        CABG    Current Outpatient Medications (Endocrine & Metabolic):    calcitonin, salmon, (MIACALCIN/FORTICAL) 200 UNIT/ACT nasal spray, Place 1 spray into alternate nostrils daily. Take 2 tums with it  Current Outpatient Medications (Cardiovascular):    benazepril (LOTENSIN) 20 MG tablet, Take 20 mg by mouth daily.   hydrochlorothiazide (HYDRODIURIL) 25 MG tablet, TAKE ONE TABLET BY MOUTH EVERY DAY   pravastatin (PRAVACHOL) 10 MG tablet, Take 10 mg by mouth at bedtime.    rosuvastatin (CRESTOR) 20 MG tablet, Take 20 mg by mouth daily.  Current Outpatient Medications (Respiratory):    Fluticasone-Umeclidin-Vilant (TRELEGY ELLIPTA IN), Inhale 1 puff into the lungs daily.  Current Outpatient Medications (Analgesics):    aspirin EC 81 MG tablet, Take 1 tablet (81 mg total) by mouth daily. Swallow whole.   HYDROcodone-acetaminophen (NORCO/VICODIN) 5-325 MG tablet, Take 1 tablet by mouth every 12 (twelve) hours as needed for moderate pain (pain score 4-6).  Current Outpatient Medications (Hematological):    clopidogrel (PLAVIX) 75 MG tablet, 1 tablet Orally Once a day  Current Outpatient Medications (Other):    Calcium Carbonate-Vitamin D (CALTRATE 600+D PO), Take by mouth.   citalopram (CELEXA) 10 MG tablet, Take 10 mg by mouth daily.   NEOMYCIN-POLYMYXIN-HYDROCORTISONE (CORTISPORIN) 1 % SOLN OTIC solution, Apply 1-2 drops to toe BID after soaking   Propylene Glycol 0.95 % SOLN, Place 1 drop into both eyes daily.   Reviewed prior external information including notes and imaging from  primary care provider As well as notes that were available from care everywhere and other healthcare systems.  Past medical history, social, surgical and family history all reviewed in electronic medical record.  No pertanent information unless stated regarding to the chief complaint.   Review of Systems:  No headache, visual changes, nausea, vomiting, diarrhea, constipation, dizziness, abdominal pain, skin rash, fevers, chills, night sweats, weight loss, swollen lymph nodes, body aches, joint swelling, chest pain, shortness of breath, mood changes. POSITIVE muscle aches denies any bowel or bladder, denies any abdominal pain  Objective  Blood pressure (!) 142/88, pulse (!) 40, height 5\' 6"  (1.676 m), weight 134 lb (60.8 kg), SpO2 92%.   General: No apparent distress alert and oriented x3 mood and affect normal, dressed appropriately.  HEENT: Pupils equal, extraocular movements  intact  Respiratory: Patient's speak in full sentences and does not appear short of breath  Cardiovascular: No lower extremity edema, non tender, no erythema  Severe scoliosis noted and degenerative changes.  Difficulty with even standing.  Severe pain with palpation in the mid line at L3 area.  Tightness with straight leg test bilaterally but no true radicular symptoms.  Tenderness over the lateral hips    Impression and Recommendations:    The above documentation has been reviewed and is accurate and complete Judi Saa, DO

## 2023-06-19 ENCOUNTER — Ambulatory Visit
Admission: RE | Admit: 2023-06-19 | Discharge: 2023-06-19 | Discharge status: Home / Self Care | Payer: PPO | Source: Ambulatory Visit | Attending: Family Medicine | Admitting: Family Medicine

## 2023-06-19 DIAGNOSIS — M545 Low back pain, unspecified: Secondary | ICD-10-CM

## 2023-06-19 DIAGNOSIS — S32028A Other fracture of second lumbar vertebra, initial encounter for closed fracture: Secondary | ICD-10-CM | POA: Diagnosis not present

## 2023-06-19 DIAGNOSIS — M47816 Spondylosis without myelopathy or radiculopathy, lumbar region: Secondary | ICD-10-CM | POA: Diagnosis not present

## 2023-06-19 DIAGNOSIS — M5126 Other intervertebral disc displacement, lumbar region: Secondary | ICD-10-CM | POA: Diagnosis not present

## 2023-06-20 ENCOUNTER — Encounter: Payer: Self-pay | Admitting: Family Medicine

## 2023-06-20 ENCOUNTER — Telehealth: Payer: Self-pay | Admitting: Family Medicine

## 2023-06-20 ENCOUNTER — Other Ambulatory Visit: Payer: Self-pay | Admitting: Family Medicine

## 2023-06-20 DIAGNOSIS — S32030A Wedge compression fracture of third lumbar vertebra, initial encounter for closed fracture: Secondary | ICD-10-CM

## 2023-06-20 NOTE — Telephone Encounter (Signed)
Patient's daughter called and left a message after hours to follow up on the CT Scan that Christie Peters had done yesterday.

## 2023-06-22 ENCOUNTER — Telehealth: Payer: Self-pay | Admitting: Family Medicine

## 2023-06-22 MED ORDER — GABAPENTIN 100 MG PO CAPS: 200.0000 mg | ORAL_CAPSULE | Freq: Every day | ORAL | 3 refills | Status: DC

## 2023-06-22 NOTE — Telephone Encounter (Signed)
Called pt daughter, Clerance Lav. Informed that patient pain meds would not be refilled due to provider concerns. She understood.  Clerance Lav was able to get patient an appt with [facility], Dr. Maisie Fus on 06/25/2023 at 2pm.   Clerance Lav informed if pt worsens or has concerns before the Neurosurgery appt, we recommend [facility] for her care.

## 2023-06-22 NOTE — Telephone Encounter (Signed)
Pt called. She has not seen any of our MyChart msgs. Given CT results and informed of urgent Neuro referral. Also gave this info to daughter today.  Pt out of pain meds. Still in pain and had some constipation. Feels like she tolerated this med fairly well and needs something to help with the pain. Agreeable to something else if Dr. Katrinka Blazing recommends.  [facility] pharmacy will deliver if med called in before 2pm.

## 2023-06-22 NOTE — Telephone Encounter (Signed)
Discussed with daughter discussed concern with the opioids  Wll try   Gabapentin 100mg  at night   Did discuss again worsening pain go to ER

## 2023-06-22 NOTE — Telephone Encounter (Signed)
I would reccomend e.r. at this time.  Concern too much pain meds may cause more constipation and may need then more intervention. Also might be fastest way to get her to see neurosurgery

## 2023-06-25 DIAGNOSIS — M8008XA Age-related osteoporosis with current pathological fracture, vertebra(e), initial encounter for fracture: Secondary | ICD-10-CM | POA: Diagnosis not present

## 2023-06-26 ENCOUNTER — Other Ambulatory Visit: Payer: Self-pay | Admitting: Neurosurgery

## 2023-06-26 DIAGNOSIS — M8008XA Age-related osteoporosis with current pathological fracture, vertebra(e), initial encounter for fracture: Secondary | ICD-10-CM

## 2023-06-29 ENCOUNTER — Ambulatory Visit
Admission: RE | Admit: 2023-06-29 | Discharge: 2023-06-29 | Disposition: A | Discharge status: Home / Self Care | Payer: PPO | Source: Ambulatory Visit | Attending: Neurosurgery

## 2023-06-29 DIAGNOSIS — S32028A Other fracture of second lumbar vertebra, initial encounter for closed fracture: Secondary | ICD-10-CM | POA: Diagnosis not present

## 2023-06-29 DIAGNOSIS — M8008XA Age-related osteoporosis with current pathological fracture, vertebra(e), initial encounter for fracture: Secondary | ICD-10-CM

## 2023-06-29 DIAGNOSIS — M545 Low back pain, unspecified: Secondary | ICD-10-CM | POA: Diagnosis not present

## 2023-07-05 DIAGNOSIS — M6281 Muscle weakness (generalized): Secondary | ICD-10-CM | POA: Diagnosis not present

## 2023-07-05 DIAGNOSIS — R2681 Unsteadiness on feet: Secondary | ICD-10-CM | POA: Diagnosis not present

## 2023-07-05 DIAGNOSIS — Z9181 History of falling: Secondary | ICD-10-CM | POA: Diagnosis not present

## 2023-07-06 DIAGNOSIS — Z9181 History of falling: Secondary | ICD-10-CM | POA: Diagnosis not present

## 2023-07-06 DIAGNOSIS — M6281 Muscle weakness (generalized): Secondary | ICD-10-CM | POA: Diagnosis not present

## 2023-07-10 DIAGNOSIS — M8088XA Other osteoporosis with current pathological fracture, vertebra(e), initial encounter for fracture: Secondary | ICD-10-CM | POA: Diagnosis not present

## 2023-07-10 DIAGNOSIS — M8008XA Age-related osteoporosis with current pathological fracture, vertebra(e), initial encounter for fracture: Secondary | ICD-10-CM | POA: Diagnosis not present

## 2023-07-10 DIAGNOSIS — S32009A Unspecified fracture of unspecified lumbar vertebra, initial encounter for closed fracture: Secondary | ICD-10-CM | POA: Diagnosis not present

## 2023-07-17 DIAGNOSIS — S32020A Wedge compression fracture of second lumbar vertebra, initial encounter for closed fracture: Secondary | ICD-10-CM | POA: Diagnosis not present

## 2023-07-17 DIAGNOSIS — E785 Hyperlipidemia, unspecified: Secondary | ICD-10-CM | POA: Diagnosis not present

## 2023-07-17 DIAGNOSIS — W010XXA Fall on same level from slipping, tripping and stumbling without subsequent striking against object, initial encounter: Secondary | ICD-10-CM | POA: Diagnosis not present

## 2023-07-17 DIAGNOSIS — R2681 Unsteadiness on feet: Secondary | ICD-10-CM | POA: Diagnosis not present

## 2023-07-17 DIAGNOSIS — I10 Essential (primary) hypertension: Secondary | ICD-10-CM | POA: Diagnosis not present

## 2023-07-17 DIAGNOSIS — M8080XA Other osteoporosis with current pathological fracture, unspecified site, initial encounter for fracture: Secondary | ICD-10-CM | POA: Diagnosis not present

## 2023-07-17 DIAGNOSIS — M8580 Other specified disorders of bone density and structure, unspecified site: Secondary | ICD-10-CM | POA: Diagnosis not present

## 2023-07-17 DIAGNOSIS — M810 Age-related osteoporosis without current pathological fracture: Secondary | ICD-10-CM | POA: Diagnosis not present

## 2023-07-23 ENCOUNTER — Other Ambulatory Visit (HOSPITAL_COMMUNITY): Payer: Self-pay

## 2023-07-23 DIAGNOSIS — Z9889 Other specified postprocedural states: Secondary | ICD-10-CM | POA: Diagnosis not present

## 2023-07-23 DIAGNOSIS — Z961 Presence of intraocular lens: Secondary | ICD-10-CM | POA: Diagnosis not present

## 2023-07-23 DIAGNOSIS — H18453 Nodular corneal degeneration, bilateral: Secondary | ICD-10-CM | POA: Diagnosis not present

## 2023-07-24 ENCOUNTER — Ambulatory Visit (HOSPITAL_COMMUNITY)
Admission: RE | Admit: 2023-07-24 | Discharge: 2023-07-24 | Disposition: A | Discharge status: Home / Self Care | Payer: PPO | Source: Ambulatory Visit | Attending: Internal Medicine | Admitting: Internal Medicine

## 2023-07-24 DIAGNOSIS — M810 Age-related osteoporosis without current pathological fracture: Secondary | ICD-10-CM | POA: Diagnosis not present

## 2023-07-24 DIAGNOSIS — Z9181 History of falling: Secondary | ICD-10-CM | POA: Diagnosis not present

## 2023-07-24 DIAGNOSIS — M6281 Muscle weakness (generalized): Secondary | ICD-10-CM | POA: Diagnosis not present

## 2023-07-24 MED ORDER — DENOSUMAB 60 MG/ML ~~LOC~~ SOSY
PREFILLED_SYRINGE | SUBCUTANEOUS | Status: AC
  Filled 2023-07-24: qty 1

## 2023-07-24 MED ORDER — DENOSUMAB 60 MG/ML ~~LOC~~ SOSY
60.0000 mg | PREFILLED_SYRINGE | Freq: Once | SUBCUTANEOUS | Status: AC
  Administered 2023-07-24: 60 mg via SUBCUTANEOUS

## 2023-07-25 DIAGNOSIS — M8008XA Age-related osteoporosis with current pathological fracture, vertebra(e), initial encounter for fracture: Secondary | ICD-10-CM | POA: Diagnosis not present

## 2023-08-02 DIAGNOSIS — I739 Peripheral vascular disease, unspecified: Secondary | ICD-10-CM | POA: Diagnosis not present

## 2023-08-02 DIAGNOSIS — Z1331 Encounter for screening for depression: Secondary | ICD-10-CM | POA: Diagnosis not present

## 2023-08-02 DIAGNOSIS — R2681 Unsteadiness on feet: Secondary | ICD-10-CM | POA: Diagnosis not present

## 2023-08-02 DIAGNOSIS — J449 Chronic obstructive pulmonary disease, unspecified: Secondary | ICD-10-CM | POA: Diagnosis not present

## 2023-08-02 DIAGNOSIS — E785 Hyperlipidemia, unspecified: Secondary | ICD-10-CM | POA: Diagnosis not present

## 2023-08-02 DIAGNOSIS — Z1339 Encounter for screening examination for other mental health and behavioral disorders: Secondary | ICD-10-CM | POA: Diagnosis not present

## 2023-08-02 DIAGNOSIS — M810 Age-related osteoporosis without current pathological fracture: Secondary | ICD-10-CM | POA: Diagnosis not present

## 2023-08-02 DIAGNOSIS — K581 Irritable bowel syndrome with constipation: Secondary | ICD-10-CM | POA: Diagnosis not present

## 2023-08-02 DIAGNOSIS — Z0000 Encounter for general adult medical examination without abnormal findings: Secondary | ICD-10-CM | POA: Diagnosis not present

## 2023-08-02 DIAGNOSIS — M545 Low back pain, unspecified: Secondary | ICD-10-CM | POA: Diagnosis not present

## 2023-08-02 DIAGNOSIS — I10 Essential (primary) hypertension: Secondary | ICD-10-CM | POA: Diagnosis not present

## 2023-08-02 DIAGNOSIS — R82998 Other abnormal findings in urine: Secondary | ICD-10-CM | POA: Diagnosis not present

## 2023-08-10 ENCOUNTER — Ambulatory Visit: Payer: Medicare HMO

## 2023-08-10 ENCOUNTER — Encounter (HOSPITAL_COMMUNITY): Payer: Medicare HMO

## 2023-08-13 DIAGNOSIS — Z961 Presence of intraocular lens: Secondary | ICD-10-CM | POA: Diagnosis not present

## 2023-08-13 DIAGNOSIS — H1789 Other corneal scars and opacities: Secondary | ICD-10-CM | POA: Diagnosis not present

## 2023-08-21 DIAGNOSIS — R2681 Unsteadiness on feet: Secondary | ICD-10-CM | POA: Diagnosis not present

## 2023-08-22 DIAGNOSIS — M8008XA Age-related osteoporosis with current pathological fracture, vertebra(e), initial encounter for fracture: Secondary | ICD-10-CM | POA: Diagnosis not present

## 2023-08-24 DIAGNOSIS — R2681 Unsteadiness on feet: Secondary | ICD-10-CM | POA: Diagnosis not present

## 2023-08-30 DIAGNOSIS — R2681 Unsteadiness on feet: Secondary | ICD-10-CM | POA: Diagnosis not present

## 2023-09-06 ENCOUNTER — Other Ambulatory Visit: Payer: Self-pay | Admitting: *Deleted

## 2023-09-06 DIAGNOSIS — I771 Stricture of artery: Secondary | ICD-10-CM

## 2023-09-06 DIAGNOSIS — I739 Peripheral vascular disease, unspecified: Secondary | ICD-10-CM

## 2023-09-06 DIAGNOSIS — I70211 Atherosclerosis of native arteries of extremities with intermittent claudication, right leg: Secondary | ICD-10-CM

## 2023-09-07 DIAGNOSIS — R2681 Unsteadiness on feet: Secondary | ICD-10-CM | POA: Diagnosis not present

## 2023-09-14 DIAGNOSIS — R2681 Unsteadiness on feet: Secondary | ICD-10-CM | POA: Diagnosis not present

## 2023-09-19 DIAGNOSIS — R2681 Unsteadiness on feet: Secondary | ICD-10-CM | POA: Diagnosis not present

## 2023-09-20 ENCOUNTER — Ambulatory Visit (HOSPITAL_COMMUNITY)
Admission: RE | Admit: 2023-09-20 | Discharge: 2023-09-20 | Disposition: A | Discharge status: Home / Self Care | Payer: Self-pay | Source: Ambulatory Visit | Attending: Vascular Surgery | Admitting: Vascular Surgery

## 2023-09-20 ENCOUNTER — Ambulatory Visit: Attending: Vascular Surgery | Admitting: Physician Assistant

## 2023-09-20 VITALS — BP 169/96 | HR 76 | Temp 98.2°F | Wt 132.9 lb

## 2023-09-20 DIAGNOSIS — I70211 Atherosclerosis of native arteries of extremities with intermittent claudication, right leg: Secondary | ICD-10-CM | POA: Diagnosis not present

## 2023-09-20 DIAGNOSIS — I739 Peripheral vascular disease, unspecified: Secondary | ICD-10-CM

## 2023-09-20 DIAGNOSIS — I771 Stricture of artery: Secondary | ICD-10-CM | POA: Diagnosis not present

## 2023-09-20 LAB — VAS US ABI WITH/WO TBI
Left ABI: 0.65
Right ABI: 0.74

## 2023-09-20 NOTE — Progress Notes (Signed)
 Office Note     CC:  follow up Requesting Provider:  Bertha Broad, MD  HPI: Christie Peters is a 88 y.o. (1936/02/28) female who presents for surveillance of PAD.  She underwent aortogram with right external iliac artery stenting on 03/08/2022 by Dr. Rosalva Comber due to right leg claudication.  She no longer has lifestyle limiting claudication since her procedure.  She has a known right SFA occlusion.  She denies rest pain or tissue loss of bilateral lower extremities.  She also has venous insufficiency with varicosities of bilateral lower extremities however these do not bother her.  She continues to take aspirin , Plavix , statin daily.  She denies tobacco use.   Past Medical History:  Diagnosis Date   Dental crowns present    Dupuytren's contracture of left hand 09/2014   left middle finger   Hyperlipidemia    Hypertension    states under control with meds., has been on med. x 2 yr.    Past Surgical History:  Procedure Laterality Date   ABDOMINAL AORTOGRAM W/LOWER EXTREMITY N/A 03/08/2022   Procedure: ABDOMINAL AORTOGRAM W/LOWER EXTREMITY;  Surgeon: Kayla Part, MD;  Location: [facility] INVASIVE CV LAB;  Service: Cardiovascular;  Laterality: N/A;   BLEPHAROPLASTY Bilateral 02/17/2013   upper lid   CATARACT EXTRACTION W/ INTRAOCULAR LENS  IMPLANT, BILATERAL     CESAREAN SECTION     COLONOSCOPY WITH PROPOFOL   03/21/2013   FASCIOTOMY Left 10/01/2014   Procedure: FASCIOTOMY LEFT MIDDLE FINGER, FASCIOTOMY LEFT RING FINGER,  FASCIOTOMY LEFT SMALL FINGER ;  Surgeon: Lyanne Sample, MD;  Location: [facility];  Service: Orthopedics;  Laterality: Left;   PERIPHERAL VASCULAR INTERVENTION Right 03/08/2022   Procedure: PERIPHERAL VASCULAR INTERVENTION;  Surgeon: Kayla Part, MD;  Location: [facility] INVASIVE CV LAB;  Service: Cardiovascular;  Laterality: Right;    Social History   Socioeconomic History   Marital status: Married    Spouse name: Not on file   Number of children: 3    Years of education: Not on file   Highest education level: Not on file  Occupational History   Occupation: retired   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  Tobacco Use   Smoking status: Former    Current packs/day: 0.00    Average packs/day: 0.5 packs/day for 60.0 years (30.0 ttl pk-yrs)    Types: Cigarettes    Start date: 09/18/1953    Quit date: 09/18/2013    Years since quitting: 10.0   Smokeless tobacco: Never   Tobacco comments:    pt smoked off and on for about 60 years, reports stopping several times and then starting back  Vaping Use   Vaping status: Never Used  Substance and Sexual Activity   Alcohol  use: Yes    Comment: occasionally   Drug use: No   Sexual activity: Not on file  Other Topics Concern   Not on file  Social History Narrative   4 children, one is decreased   Original from [place]   Exercise- no   Diet- she is careful w/ diet   Will spend 2 months in [place]  this winter    Social Drivers of Corporate investment banker Strain: Not on file  Food Insecurity: Not on file  Transportation Needs: Not on file  Physical Activity: Not on file  Stress: Not on file  Social Connections: Not on file  Intimate Partner Violence: Not on file    Family History  Problem Relation Age of Onset   Stroke  Mother    Hypertension Father    Breast cancer Sister    Birth defects Sister    Diabetes Brother    Heart disease Brother        CABG    Current Outpatient Medications  Medication Sig Dispense Refill   aspirin  EC 81 MG tablet Take 1 tablet (81 mg total) by mouth daily. Swallow whole. 90 tablet 3   benazepril  (LOTENSIN ) 20 MG tablet Take 20 mg by mouth daily.     calcitonin, salmon, (MIACALCIN/FORTICAL) 200 UNIT/ACT nasal spray Place 1 spray into alternate nostrils daily. Take 2 tums with it 3.7 mL 0   Calcium Carbonate-Vitamin D  (CALTRATE 600+D PO) Take by mouth.     cilostazol (PLETAL) 50 MG tablet SMARTSIG:1 Tablet(s) By Mouth Morning-Evening      citalopram (CELEXA) 10 MG tablet Take 10 mg by mouth daily.     clopidogrel  (PLAVIX ) 75 MG tablet 1 tablet Orally Once a day     Fluticasone-Umeclidin-Vilant (TRELEGY ELLIPTA IN) Inhale 1 puff into the lungs daily.     gabapentin  (NEURONTIN ) 100 MG capsule Take 2 capsules (200 mg total) by mouth at bedtime. 60 capsule 3   hydrochlorothiazide  (HYDRODIURIL ) 25 MG tablet TAKE ONE TABLET BY MOUTH EVERY DAY 30 tablet 0   HYDROcodone -acetaminophen  (NORCO/VICODIN) 5-325 MG tablet Take 1 tablet by mouth every 12 (twelve) hours as needed for moderate pain (pain score 4-6). 10 tablet 0   NEOMYCIN -POLYMYXIN-HYDROCORTISONE (CORTISPORIN) 1 % SOLN OTIC solution Apply 1-2 drops to toe BID after soaking 10 mL 1   pravastatin  (PRAVACHOL ) 10 MG tablet Take 10 mg by mouth at bedtime.     Propylene Glycol 0.95 % SOLN Place 1 drop into both eyes daily.     rosuvastatin (CRESTOR) 20 MG tablet Take 20 mg by mouth daily. (Patient not taking: Reported on 09/20/2023)     No current facility-administered medications for this visit.    Allergies  Allergen Reactions   Levofloxacin  Itching and Rash   Ciprofloxacin Hcl     Other Reaction(s): avoid use with thoracic aneurysm     REVIEW OF SYSTEMS:   [X]  denotes positive finding, [ ]  denotes negative finding Cardiac  Comments:  Chest pain or chest pressure:    Shortness of breath upon exertion:    Short of breath when lying flat:    Irregular heart rhythm:        Vascular    Pain in calf, thigh, or hip brought on by ambulation:    Pain in feet at night that wakes you up from your sleep:     Blood clot in your veins:    Leg swelling:         Pulmonary    Oxygen  at home:    Productive cough:     Wheezing:         Neurologic    Sudden weakness in arms or legs:     Sudden numbness in arms or legs:     Sudden onset of difficulty speaking or slurred speech:    Temporary loss of vision in one eye:     Problems with dizziness:         Gastrointestinal     Blood in stool:     Vomited blood:         Genitourinary    Burning when urinating:     Blood in urine:        Psychiatric    Major depression:  Hematologic    Bleeding problems:    Problems with blood clotting too easily:        Skin    Rashes or ulcers:        Constitutional    Fever or chills:      PHYSICAL EXAMINATION:  Vitals:   09/20/23 1020  BP: (!) 169/96  Pulse: 76  Temp: 98.2 F (36.8 C)  TempSrc: Temporal  SpO2: 94%  Weight: 132 lb 14.4 oz (60.3 kg)    General:  WDWN in NAD; vital signs documented above Gait: Not observed HENT: WNL, normocephalic Pulmonary: normal non-labored breathing , without Rales, rhonchi,  wheezing Cardiac: regular HR Abdomen: soft, NT, no masses Skin: without rashes Vascular Exam/Pulses: Absent pedal pulses Extremities: without ischemic changes, without Gangrene , without cellulitis; without open wounds;  Musculoskeletal: no muscle wasting or atrophy  Neurologic: A&O X 3 Psychiatric:  The pt has Normal affect.   Non-Invasive Vascular Imaging:   Right external iliac artery stent is widely patent  ABI/TBIToday's ABIToday's TBIPrevious ABIPrevious TBI  +-------+-----------+-----------+------------+------------+  Right 0.74       0.54       0.65        0.46          +-------+-----------+-----------+------------+------------+  Left  0.65       0.45       0.55        0.38          +-------+-----------+-----------+------------+------------+   ASSESSMENT/PLAN:: 88 y.o. female here for follow up for surveillance of PAD with history of right external iliac artery stenting  Subjectively the patient continues to be doing well.  She has not had any return of claudication symptoms of her right lower extremity since external iliac artery stenting in 2023.  She also continues to be without rest pain or tissue loss of bilateral lower extremities.  Duplex demonstrates a widely patent right external iliac artery  stents.  She has a known right SFA occlusion however no indication for intervention given lack of symptoms.  She will continue her aspirin , Plavix , statin daily.  We will repeat aortoiliac duplex and ABIs in 1 year.   Cordie Deters, PA-C [facility] [phone]  Clinic MD:   Rosalva Comber

## 2023-09-28 DIAGNOSIS — R2681 Unsteadiness on feet: Secondary | ICD-10-CM | POA: Diagnosis not present

## 2023-10-26 ENCOUNTER — Ambulatory Visit: Admitting: Sports Medicine

## 2023-10-26 ENCOUNTER — Ambulatory Visit (INDEPENDENT_AMBULATORY_CARE_PROVIDER_SITE_OTHER)

## 2023-10-26 VITALS — BP 118/84 | HR 74 | Ht 66.0 in | Wt 132.0 lb

## 2023-10-26 DIAGNOSIS — M1811 Unilateral primary osteoarthritis of first carpometacarpal joint, right hand: Secondary | ICD-10-CM | POA: Diagnosis not present

## 2023-10-26 DIAGNOSIS — M79641 Pain in right hand: Secondary | ICD-10-CM

## 2023-10-26 NOTE — Patient Instructions (Signed)
 Tylenol  [phone] mg 2-3 times a day for pain relief  Voltaren gel over areas of pain  Warm hand baths Thumb spica brace  Follow up 12/03/2023

## 2023-10-26 NOTE — Progress Notes (Signed)
 Ben Lashayla Armes D.[facility] [address] [place] [address] Phone: [phone]   Assessment and Plan:     1. Right hand pain -Chronic with exacerbation, initial visit - Consistent with flare of right hand CMC osteoarthritis based on HPI, physical exam, imaging - X-ray obtained in clinic.  My interpretation: No acute fracture or dislocation.  Degenerative changes most significant at [facility] and fourth PIP - Recommend using warm hand baths, topical Voltaren gel to decrease hand pain - Start Tylenol  500 to 1000 mg tablets 2-3 times a day for day-to-day pain relief  -May use thumb brace when painful to decrease irritation  Pertinent previous records reviewed include none  Follow Up: 6 weeks for reevaluation.  If still having pain, would consider [facility]   Subjective:   I, Moenique Parris, am serving as a Neurosurgeon for Doctor Ulysees Gander  Chief Complaint: right hand pain   HPI:   10/26/23 Patient is a 88 year old female with right hand pain. Patient states no MOI. Pain started about 3 weeks ago. Pain is on the thumb . ROM WNL. She sates she had a bruise but that has gone away. Had decreased grip strength. Aleve for the pain didn't help much. No numbness or tingling.    Relevant Historical Information: Hypertension, COPD, osteoporosis  Additional pertinent review of systems negative.   Current Outpatient Medications:    aspirin  EC 81 MG tablet, Take 1 tablet (81 mg total) by mouth daily. Swallow whole., Disp: 90 tablet, Rfl: 3   benazepril  (LOTENSIN ) 20 MG tablet, Take 20 mg by mouth daily., Disp: , Rfl:    calcitonin, salmon, (MIACALCIN/FORTICAL) 200 UNIT/ACT nasal spray, Place 1 spray into alternate nostrils daily. Take 2 tums with it, Disp: 3.7 mL, Rfl: 0   Calcium Carbonate-Vitamin D  (CALTRATE 600+D PO), Take by mouth., Disp: , Rfl:    cilostazol (PLETAL) 50 MG tablet, SMARTSIG:1 Tablet(s) By Mouth Morning-Evening, Disp: , Rfl:     citalopram (CELEXA) 10 MG tablet, Take 10 mg by mouth daily., Disp: , Rfl:    clopidogrel  (PLAVIX ) 75 MG tablet, 1 tablet Orally Once a day, Disp: , Rfl:    Fluticasone-Umeclidin-Vilant (TRELEGY ELLIPTA IN), Inhale 1 puff into the lungs daily., Disp: , Rfl:    gabapentin  (NEURONTIN ) 100 MG capsule, Take 2 capsules (200 mg total) by mouth at bedtime., Disp: 60 capsule, Rfl: 3   hydrochlorothiazide  (HYDRODIURIL ) 25 MG tablet, TAKE ONE TABLET BY MOUTH EVERY DAY, Disp: 30 tablet, Rfl: 0   HYDROcodone -acetaminophen  (NORCO/VICODIN) 5-325 MG tablet, Take 1 tablet by mouth every 12 (twelve) hours as needed for moderate pain (pain score 4-6)., Disp: 10 tablet, Rfl: 0   NEOMYCIN -POLYMYXIN-HYDROCORTISONE (CORTISPORIN) 1 % SOLN OTIC solution, Apply 1-2 drops to toe BID after soaking, Disp: 10 mL, Rfl: 1   pravastatin  (PRAVACHOL ) 10 MG tablet, Take 10 mg by mouth at bedtime., Disp: , Rfl:    Propylene Glycol 0.95 % SOLN, Place 1 drop into both eyes daily., Disp: , Rfl:    rosuvastatin (CRESTOR) 20 MG tablet, Take 20 mg by mouth daily. (Patient not taking: Reported on 09/20/2023), Disp: , Rfl:    Objective:     Vitals:   10/26/23 0908  BP: 118/84  Pulse: 74  SpO2: 97%  Weight: 132 lb (59.9 kg)  Height: 5\' 6"  (1.676 m)      Body mass index is 21.31 kg/m.    Physical Exam:    General: Appears well, nad, nontoxic and  pleasant Neuro:sensation intact, strength is 5/5 in upper extremities, muscle tone wnl Skin:no susupicious lesions or rashes   Right hand/Wrist:   No deformity or swelling appreciated. ROM  Ext 90, flexion 70, radial/ulnar deviation 30 TTP CMC nttp over the snuff box, dorsal carpals, volar carpals, radial styloid, ulnar styloid, 1st mcp, tfcc Negative Tinel's, Phalen's, Prayer Tests Negative finklestein Neg tfcc bounce test No pain with resisted ext, flex or deviation    Electronically signed by:  Marshall Skeeter D.[facility] 9:59 AM 10/26/23

## 2023-10-29 ENCOUNTER — Ambulatory Visit: Payer: Self-pay | Admitting: Sports Medicine

## 2023-11-05 DIAGNOSIS — Z961 Presence of intraocular lens: Secondary | ICD-10-CM | POA: Diagnosis not present

## 2023-11-05 DIAGNOSIS — H18451 Nodular corneal degeneration, right eye: Secondary | ICD-10-CM | POA: Diagnosis not present

## 2023-11-05 DIAGNOSIS — Z9889 Other specified postprocedural states: Secondary | ICD-10-CM | POA: Diagnosis not present

## 2023-11-30 NOTE — Progress Notes (Unsigned)
    [facility] [address] [place] [address] Phone: [phone]   Assessment and Plan:     There are no diagnoses linked to this encounter.  ***   Pertinent previous records reviewed include ***    Follow Up: ***     Subjective:   I, Christie Peters, am serving as a Neurosurgeon for Doctor Christie Peters   Chief Complaint: right hand pain    HPI:    10/26/23 Patient is a 88 year old female with right hand pain. Patient states no MOI. Pain started about 3 weeks ago. Pain is on the thumb . ROM WNL. She sates she had a bruise but that has gone away. Had decreased grip strength. Aleve for the pain didn't help much. No numbness or tingling.    12/03/2023 Patient states   Relevant Historical Information: Hypertension, COPD, osteoporosis  Additional pertinent review of systems negative.   Current Outpatient Medications:    aspirin  EC 81 MG tablet, Take 1 tablet (81 mg total) by mouth daily. Swallow whole., Disp: 90 tablet, Rfl: 3   benazepril  (LOTENSIN ) 20 MG tablet, Take 20 mg by mouth daily., Disp: , Rfl:    calcitonin, salmon, (MIACALCIN/FORTICAL) 200 UNIT/ACT nasal spray, Place 1 spray into alternate nostrils daily. Take 2 tums with it, Disp: 3.7 mL, Rfl: 0   Calcium Carbonate-Vitamin D  (CALTRATE 600+D PO), Take by mouth., Disp: , Rfl:    cilostazol (PLETAL) 50 MG tablet, SMARTSIG:1 Tablet(s) By Mouth Morning-Evening, Disp: , Rfl:    citalopram (CELEXA) 10 MG tablet, Take 10 mg by mouth daily., Disp: , Rfl:    clopidogrel  (PLAVIX ) 75 MG tablet, 1 tablet Orally Once a day, Disp: , Rfl:    Fluticasone-Umeclidin-Vilant (TRELEGY ELLIPTA IN), Inhale 1 puff into the lungs daily., Disp: , Rfl:    gabapentin  (NEURONTIN ) 100 MG capsule, Take 2 capsules (200 mg total) by mouth at bedtime., Disp: 60 capsule, Rfl: 3   hydrochlorothiazide  (HYDRODIURIL ) 25 MG tablet, TAKE ONE TABLET BY MOUTH EVERY DAY, Disp: 30 tablet, Rfl: 0    HYDROcodone -acetaminophen  (NORCO/VICODIN) 5-325 MG tablet, Take 1 tablet by mouth every 12 (twelve) hours as needed for moderate pain (pain score 4-6)., Disp: 10 tablet, Rfl: 0   NEOMYCIN -POLYMYXIN-HYDROCORTISONE (CORTISPORIN) 1 % SOLN OTIC solution, Apply 1-2 drops to toe BID after soaking, Disp: 10 mL, Rfl: 1   pravastatin  (PRAVACHOL ) 10 MG tablet, Take 10 mg by mouth at bedtime., Disp: , Rfl:    Propylene Glycol 0.95 % SOLN, Place 1 drop into both eyes daily., Disp: , Rfl:    rosuvastatin (CRESTOR) 20 MG tablet, Take 20 mg by mouth daily. (Patient not taking: Reported on 09/20/2023), Disp: , Rfl:    Objective:     There were no vitals filed for this visit.    There is no height or weight on file to calculate BMI.    Physical Exam:    ***   Electronically signed by:  Christie Peters [facility] 7:38 AM 11/30/23

## 2023-12-03 ENCOUNTER — Ambulatory Visit: Admitting: Sports Medicine

## 2023-12-03 VITALS — HR 49 | Ht 66.0 in | Wt 132.0 lb

## 2023-12-03 DIAGNOSIS — M1811 Unilateral primary osteoarthritis of first carpometacarpal joint, right hand: Secondary | ICD-10-CM | POA: Diagnosis not present

## 2023-12-03 DIAGNOSIS — M79641 Pain in right hand: Secondary | ICD-10-CM

## 2023-12-03 NOTE — Patient Instructions (Signed)
 As needed follow up  Voltaren gel over areas of pain  Warm hand baths  Tylenol  [phone] mg 2-3 times a day for pain relief

## 2024-02-05 DIAGNOSIS — H18453 Nodular corneal degeneration, bilateral: Secondary | ICD-10-CM | POA: Diagnosis not present

## 2024-02-05 DIAGNOSIS — H02135 Senile ectropion of left lower eyelid: Secondary | ICD-10-CM | POA: Diagnosis not present

## 2024-02-05 DIAGNOSIS — H02132 Senile ectropion of right lower eyelid: Secondary | ICD-10-CM | POA: Diagnosis not present

## 2024-02-05 DIAGNOSIS — Z7901 Long term (current) use of anticoagulants: Secondary | ICD-10-CM | POA: Diagnosis not present

## 2024-02-05 DIAGNOSIS — Z9981 Dependence on supplemental oxygen: Secondary | ICD-10-CM | POA: Diagnosis not present

## 2024-02-05 DIAGNOSIS — H02106 Unspecified ectropion of left eye, unspecified eyelid: Secondary | ICD-10-CM | POA: Diagnosis not present

## 2024-02-22 DIAGNOSIS — I739 Peripheral vascular disease, unspecified: Secondary | ICD-10-CM | POA: Diagnosis not present

## 2024-02-22 DIAGNOSIS — Z87891 Personal history of nicotine dependence: Secondary | ICD-10-CM | POA: Diagnosis not present

## 2024-02-22 DIAGNOSIS — I10 Essential (primary) hypertension: Secondary | ICD-10-CM | POA: Diagnosis not present

## 2024-02-22 DIAGNOSIS — K920 Hematemesis: Secondary | ICD-10-CM | POA: Diagnosis not present

## 2024-02-22 DIAGNOSIS — K219 Gastro-esophageal reflux disease without esophagitis: Secondary | ICD-10-CM | POA: Diagnosis not present

## 2024-02-22 DIAGNOSIS — K2901 Acute gastritis with bleeding: Secondary | ICD-10-CM | POA: Diagnosis not present

## 2024-02-22 DIAGNOSIS — J449 Chronic obstructive pulmonary disease, unspecified: Secondary | ICD-10-CM | POA: Diagnosis not present

## 2024-03-18 DIAGNOSIS — R600 Localized edema: Secondary | ICD-10-CM | POA: Diagnosis not present

## 2024-03-18 DIAGNOSIS — I739 Peripheral vascular disease, unspecified: Secondary | ICD-10-CM | POA: Diagnosis not present

## 2024-03-18 DIAGNOSIS — L989 Disorder of the skin and subcutaneous tissue, unspecified: Secondary | ICD-10-CM | POA: Diagnosis not present

## 2024-03-24 DIAGNOSIS — R200 Anesthesia of skin: Secondary | ICD-10-CM | POA: Diagnosis not present

## 2024-03-24 DIAGNOSIS — R600 Localized edema: Secondary | ICD-10-CM | POA: Diagnosis not present

## 2024-03-24 DIAGNOSIS — C44712 Basal cell carcinoma of skin of right lower limb, including hip: Secondary | ICD-10-CM | POA: Diagnosis not present

## 2024-03-24 DIAGNOSIS — D485 Neoplasm of uncertain behavior of skin: Secondary | ICD-10-CM | POA: Diagnosis not present

## 2024-04-07 ENCOUNTER — Telehealth: Payer: Self-pay | Admitting: Cardiovascular Disease

## 2024-04-07 NOTE — Telephone Encounter (Signed)
 Pt c/o swelling: STAT is pt has developed SOB within 24 hours  How much weight have you gained and in what time span?  No significant weight gain per daughter  If swelling, where is the swelling located?  Ankles, feet   Are you currently taking a fluid pill?  PCP prescribed fluid pill about 2 weeks ago, but it hasn't helped  Are you currently SOB?  Daughter not currently with patient, but mentions labored breathing at night   Do you have a log of your daily weights (if so, list)?    Have you gained 3 pounds in a day or 5 pounds in a week?   Have you traveled recently?  No

## 2024-04-07 NOTE — Telephone Encounter (Signed)
 I spoke with patient's daughter.  She reports patient has been having swelling in her feet and ankles for the last month. No weight gain.  Saw PCP and started on fluid medication which has not really helped the swelling.  Patient has not had increased salt intake.  Lives at [facility].  I explained to daughter since it has been over 2 years since patient last seen in our office it would be best for her to contact PCP and let them know fluid medication was not helping. Advised daughter to have patient keep legs elevated when possible.  Daughter would like patient to continue to follow in our office.  Appointment made for patient to see K. Devora, NP on 12/23.

## 2024-04-07 NOTE — Progress Notes (Unsigned)
 [facility] [address] [place] [address] Phone: [phone] Subjective:   Christie Peters, am serving as a scribe for Dr. Arthea Claudene.  I'm seeing this patient by the request  of:  [name], MD  CC: Neck pain  YEP:Dlagzrupcz  Christie Peters is a 88 y.o. female coming in with complaint of cervical spine pain. Saw patient for lower back pain in January 2025. Patient states when she rotates her head to the L she has L sided C spine pain. Has had pain for years. Suffered a fall last year and she said her pain worsened. Denies any radiating symptoms. Able to sleep but can be painful when she arises in morning.    Cervical neck x-rays from April 2024 showed the patient does have degenerative disc disease from C3-C7.    Past Medical History:  Diagnosis Date   Dental crowns present    Dupuytren's contracture of left hand 09/2014   left middle finger   Hyperlipidemia    Hypertension    states under control with meds., has been on med. x 2 yr.   Past Surgical History:  Procedure Laterality Date   ABDOMINAL AORTOGRAM W/LOWER EXTREMITY N/A 03/08/2022   Procedure: ABDOMINAL AORTOGRAM W/LOWER EXTREMITY;  Surgeon: Lanis Fonda BRAVO, MD;  Location: [facility] INVASIVE CV LAB;  Service: Cardiovascular;  Laterality: N/A;   BLEPHAROPLASTY Bilateral 02/17/2013   upper lid   CATARACT EXTRACTION W/ INTRAOCULAR LENS  IMPLANT, BILATERAL     CESAREAN SECTION     COLONOSCOPY WITH PROPOFOL   03/21/2013   FASCIOTOMY Left 10/01/2014   Procedure: FASCIOTOMY LEFT MIDDLE FINGER, FASCIOTOMY LEFT RING FINGER,  FASCIOTOMY LEFT SMALL FINGER ;  Surgeon: Arley Curia, MD;  Location: [facility];  Service: Orthopedics;  Laterality: Left;   PERIPHERAL VASCULAR INTERVENTION Right 03/08/2022   Procedure: PERIPHERAL VASCULAR INTERVENTION;  Surgeon: Lanis Fonda BRAVO, MD;  Location: [facility] INVASIVE CV LAB;  Service: Cardiovascular;  Laterality: Right;   Social History    Socioeconomic History   Marital status: Married    Spouse name: Not on file   Number of children: 3   Years of education: Not on file   Highest education level: Not on file  Occupational History   Occupation: retired   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  Tobacco Use   Smoking status: Former    Current packs/day: 0.00    Average packs/day: 0.5 packs/day for 60.0 years (30.0 ttl pk-yrs)    Types: Cigarettes    Start date: 09/18/1953    Quit date: 09/18/2013    Years since quitting: 10.5   Smokeless tobacco: Never   Tobacco comments:    pt smoked off and on for about 60 years, reports stopping several times and then starting back  Vaping Use   Vaping status: Never Used  Substance and Sexual Activity   Alcohol  use: Yes    Comment: occasionally   Drug use: No   Sexual activity: Not on file  Other Topics Concern   Not on file  Social History Narrative   4 children, one is decreased   Original from [place]   Exercise- no   Diet- she is careful w/ diet   Will spend 2 months in [place]  this winter    Social Drivers of Corporate Investment Banker Strain: Not on file  Food Insecurity: Not on file  Transportation Needs: Not on file  Physical Activity: Not on file  Stress: Not on file  Social Connections: Not on file   Allergies  Allergen Reactions   Levofloxacin  Itching and Rash   Ciprofloxacin Hcl     Other Reaction(s): avoid use with thoracic aneurysm   Family History  Problem Relation Age of Onset   Stroke Mother    Hypertension Father    Breast cancer Sister    Birth defects Sister    Diabetes Brother    Heart disease Brother        CABG    Current Outpatient Medications (Endocrine & Metabolic):    calcitonin, salmon, (MIACALCIN/FORTICAL) 200 UNIT/ACT nasal spray, Place 1 spray into alternate nostrils daily. Take 2 tums with it  Current Outpatient Medications (Cardiovascular):    benazepril  (LOTENSIN ) 20 MG tablet, Take 20 mg by mouth daily.    hydrochlorothiazide  (HYDRODIURIL ) 25 MG tablet, TAKE ONE TABLET BY MOUTH EVERY DAY   pravastatin  (PRAVACHOL ) 10 MG tablet, Take 10 mg by mouth at bedtime.  Current Outpatient Medications (Respiratory):    Fluticasone-Umeclidin-Vilant (TRELEGY ELLIPTA IN), Inhale 1 puff into the lungs daily.  Current Outpatient Medications (Analgesics):    aspirin  EC 81 MG tablet, Take 1 tablet (81 mg total) by mouth daily. Swallow whole.   HYDROcodone -acetaminophen  (NORCO/VICODIN) 5-325 MG tablet, Take 1 tablet by mouth every 12 (twelve) hours as needed for moderate pain (pain score 4-6).  Current Outpatient Medications (Hematological):    cilostazol (PLETAL) 50 MG tablet, SMARTSIG:1 Tablet(s) By Mouth Morning-Evening   clopidogrel  (PLAVIX ) 75 MG tablet, 1 tablet Orally Once a day  Current Outpatient Medications (Other):    Calcium Carbonate-Vitamin D  (CALTRATE 600+D PO), Take by mouth.   citalopram (CELEXA) 10 MG tablet, Take 10 mg by mouth daily.   gabapentin  (NEURONTIN ) 100 MG capsule, Take 2 capsules (200 mg total) by mouth at bedtime.   NEOMYCIN -POLYMYXIN-HYDROCORTISONE (CORTISPORIN) 1 % SOLN OTIC solution, Apply 1-2 drops to toe BID after soaking   Propylene Glycol 0.95 % SOLN, Place 1 drop into both eyes daily.   Reviewed prior external information including notes and imaging from  primary care provider As well as notes that were available from care everywhere and other healthcare systems.  Past medical history, social, surgical and family history all reviewed in electronic medical record.  No pertanent information unless stated regarding to the chief complaint.   Review of Systems:  No headache, visual changes, nausea, vomiting, diarrhea, constipation, dizziness, abdominal pain, skin rash, fevers, chills, night sweats, weight loss, swollen lymph nodes, body aches, joint swelling, chest pain, shortness of breath, mood changes. POSITIVE muscle aches  Objective  Blood pressure 108/76, height 5'  6 (1.676 m), weight 135 lb (61.2 kg).   General: No apparent distress alert and oriented x3 mood and affect normal, dressed appropriately.  HEENT: Pupils equal, extraocular movements intact  Respiratory: Patient's speak in full sentences and does not appear short of breath  Cardiovascular: Peripheral edema in the anterior tibial area noted today.  This is worse than patient's baseline.  Significant coolness to the feet noted with severe varus Cassity of the dorsal aspect of the foot.  Unfortunately unable to find the dorsalis pedis pulse.  Ambulates with the aid of a cane.  Neck exam shows severe limited range of motion noted.  Patient has no rotation to the left side.  Lacks the last 10 degrees of extension.  Tightness with certain range of motion noted.  Negative Spurling's.  Patient does have weakness though noted in the C7 and C8 distribution on the left side fairly significant compared to  the contralateral side.   Impression and Recommendations:    The above documentation has been reviewed and is accurate and complete Bassy Fetterly M Sandor Arboleda, DO

## 2024-04-08 ENCOUNTER — Ambulatory Visit: Admitting: Family Medicine

## 2024-04-08 ENCOUNTER — Ambulatory Visit

## 2024-04-08 ENCOUNTER — Encounter: Payer: Self-pay | Admitting: Family Medicine

## 2024-04-08 VITALS — BP 108/76 | Ht 66.0 in | Wt 135.0 lb

## 2024-04-08 DIAGNOSIS — M542 Cervicalgia: Secondary | ICD-10-CM

## 2024-04-08 DIAGNOSIS — I739 Peripheral vascular disease, unspecified: Secondary | ICD-10-CM | POA: Diagnosis not present

## 2024-04-08 DIAGNOSIS — M5030 Other cervical disc degeneration, unspecified cervical region: Secondary | ICD-10-CM | POA: Diagnosis not present

## 2024-04-08 DIAGNOSIS — R079 Chest pain, unspecified: Secondary | ICD-10-CM | POA: Diagnosis not present

## 2024-04-08 NOTE — Assessment & Plan Note (Addendum)
 Patient does have a history of stenting noted.  I am concerned that there is a worsening blood flow to the toes at the moment.  I do not get a good dorsalis pedis pulse.  Repeat ABI and aortic imaging.  Referred patient back to her vascular surgeon.  Discussed with patient about icing regimen and home exercises otherwise.  We discussed with patient the potential compression and keeping the limbs warm.  Follow-up again in 6 to 8 weeks

## 2024-04-08 NOTE — Patient Instructions (Addendum)
 MRI cervical  Xray today See vascular ASAP as well [phone] ECHO complete Call cardiology to schedule See me again in 2 months

## 2024-04-08 NOTE — Assessment & Plan Note (Signed)
 Known arthritic changes, severe overall.  We discussed with patient secondary to the severity of the arthritic changes and patient having radicular symptoms and weakness I do think that we need to consider the possibility of advanced imaging and see if patient is a candidate for the possibility of epidurals.  Patient is in agreement of the plan.  Will order these imaging stat for further evaluation and treatment options.  We discussed medications but secondary to patient's age and difficulty already with walking did not want to use any medications that could contribute to this.  Follow-up again in 6 to 8 weeks.

## 2024-04-09 ENCOUNTER — Ambulatory Visit: Attending: Emergency Medicine | Admitting: Emergency Medicine

## 2024-04-09 ENCOUNTER — Encounter: Payer: Self-pay | Admitting: Emergency Medicine

## 2024-04-09 VITALS — BP 114/60 | HR 79 | Ht 66.5 in | Wt 136.0 lb

## 2024-04-09 DIAGNOSIS — I7121 Aneurysm of the ascending aorta, without rupture: Secondary | ICD-10-CM | POA: Diagnosis not present

## 2024-04-09 DIAGNOSIS — I739 Peripheral vascular disease, unspecified: Secondary | ICD-10-CM

## 2024-04-09 DIAGNOSIS — R600 Localized edema: Secondary | ICD-10-CM | POA: Diagnosis not present

## 2024-04-09 DIAGNOSIS — I2510 Atherosclerotic heart disease of native coronary artery without angina pectoris: Secondary | ICD-10-CM | POA: Diagnosis not present

## 2024-04-09 DIAGNOSIS — I351 Nonrheumatic aortic (valve) insufficiency: Secondary | ICD-10-CM | POA: Diagnosis not present

## 2024-04-09 DIAGNOSIS — Z87898 Personal history of other specified conditions: Secondary | ICD-10-CM | POA: Diagnosis not present

## 2024-04-09 MED ORDER — FUROSEMIDE 20 MG PO TABS: 20.0000 mg | ORAL_TABLET | Freq: Every day | ORAL | 3 refills | Status: AC

## 2024-04-09 NOTE — Progress Notes (Signed)
 Cardiology Office Note:    Date:  04/09/2024  ID:  Christie Peters, DOB 04-Jun-1935, MRN [id number] PCP: [name], MD  [facility] Providers Cardiologist:  Lonni Cash, MD Cardiology APP:  Rana Lum CROME, NP       Patient Profile:       Chief Complaint: Acute visit for lower extremity swelling History of Present Illness:  Christie Peters is a 88 y.o. female with visit-pertinent history of hypertension, COPD, hyperlipidemia  She established with cardiology service on 10/2021 with Dr. Cash for evaluation of syncope.  She thinks she passed out in the bathroom in April 2023.  She fractured a bone in her foot.  She reported that she was having a pedicure and began to feel dizzy.  She then passed out.  EMS was called but she returned to her baseline she was not taken to the ED.  She did report decreased p.o. intake.  She did have a brain MRI in 09/2021 with no acute abnormality.  Cardiac monitor in June 2023 with PACs, PVCs, and brief NSVT.  There were no AV block or pauses/bradycardia noted.  Echocardiogram on 10/2021 with LVEF 60 to 65%, moderate basal septal hypertrophy, trivial MR, aortic valve sclerosis without stenosis, mild AI, mild dilation of aortic root.  Carotid Doppler 10/2021 with no significant disease.  Was last seen in clinic on 12/26/2021 by Dr. Cash.  She is doing well without acute cardiovascular concerns.  No changes are made.  She underwent CT angio chest aorta on 01/05/2022 showed stable 4.7 cm dilation of ascending thoracic aorta.  She was recently seen by her PCP on 03/18/2024 for localized edema.  She was started on Lasix 20 mg.  She had called her PCP office and reported leg swelling had not improved and she was started on spironolactone 12.5 mg daily on 11/17.  Patient called to nurse triage line 04/07/2024 reporting swelling in her feet and would like an office visit.  Discussed the use of AI scribe software for clinical note  transcription with the patient, who gave verbal consent to proceed.  History of Present Illness Christie Peters is an 88 year old female who presents with bilateral leg swelling. She is accompanied by her daughter, Christie Peters.  Bilateral leg swelling began around February 27, 2024, affecting both legs and feet with no significant change in size throughout the day. There is no significant pain associated with the swelling. She has a history of stent placement in her right leg and experiences mild leg pain, though not as severe as before the stent placement. Elevation and compression socks do not significantly change the swelling. She is able to lay flat at night and sometimes feels short of breath upon waking but she does have COPD.  She experiences shortness of breath and increased fatigue, feeling exhausted after performing one or two small tasks a day. She uses oxygen  at night and takes Trelegy.  She has had weight loss since starting furosemide.  Her past medical history includes an aortic aneurysm, which has not been imaged since 2023. She has a family history of aneurysms, as her father and brother had similar conditions. She used to smoke but has since quit.   Review of systems:  Please see the history of present illness. All other systems are reviewed and otherwise negative.      Studies Reviewed:    EKG Interpretation Date/Time:  Wednesday April 09 2024 16:00:20 EST Ventricular Rate:  79 PR Interval:  158 QRS Duration:  94 QT Interval:  400 QTC Calculation: 458 R Axis:   31  Text Interpretation: Normal sinus rhythm Normal ECG When compared with ECG of 30-Sep-2014 13:13, No significant change was found Confirmed by Rana Dixon [phone]) on 04/09/2024 4:06:26 PM    Carotid duplex 11/09/2021 Right Carotid: The extracranial vessels were near-normal with only minimal  wall                thickening or plaque.   Left Carotid: The extracranial vessels were near-normal  with only minimal  wall               thickening or plaque.   Vertebrals:  Bilateral vertebral arteries demonstrate antegrade flow.  Subclavians: Normal flow hemodynamics were seen in bilateral subclavian               arteries.   Echocardiogram 11/09/2021  1. Left ventricular ejection fraction, by estimation, is 60 to 65%. The  left ventricle has normal function. The left ventricle has no regional  wall motion abnormalities. There is moderate asymmetric left ventricular  hypertrophy of the basal-septal  segment. Left ventricular diastolic parameters were normal.   2. Right ventricular systolic function is normal. The right ventricular  size is normal. Tricuspid regurgitation signal is inadequate for assessing  PA pressure.   3. The mitral valve is normal in structure. Trivial mitral valve  regurgitation. No evidence of mitral stenosis.   4. The aortic valve is tricuspid. There is mild calcification of the  aortic valve. There is mild thickening of the aortic valve. Aortic valve  regurgitation is mild. Aortic valve sclerosis/calcification is present,  without any evidence of aortic  stenosis.   5. Aortic dilatation noted. There is mild dilatation of the ascending  aorta, measuring 43 mm.   Cardiac event monitor 10/24/2021 Sinus rhythm Several short runs of non-sustained ventricular tachycardia (longest 9 beats) Rare premature atrial contractions Rare premature ventricular contractions Risk Assessment/Calculations:              Physical Exam:   VS:  BP 114/60 (BP Location: Left Arm, Patient Position: Sitting, Cuff Size: Normal)   Pulse 79   Ht 5' 6.5 (1.689 m)   Wt 136 lb (61.7 kg)   BMI 21.62 kg/m    Wt Readings from Last 3 Encounters:  04/09/24 136 lb (61.7 kg)  04/08/24 135 lb (61.2 kg)  12/03/23 132 lb (59.9 kg)    GEN: Well nourished, well developed in no acute distress NECK: No JVD; No carotid bruits CARDIAC: RRR, no murmurs, rubs, gallops RESPIRATORY:  Clear to  auscultation without rales, wheezing or rhonchi  ABDOMEN: Soft, non-tender, non-distended EXTREMITIES: 1+ bilateral pedal edema; No acute deformity      Assessment and Plan:  Lower extremity swelling Dyspnea Patient presents with bilateral lower extremity swelling which has been ongoing for the past 6 weeks starting around the beginning of October.  Her leg swelling has been accompanied by worsening of her chronic shortness of breath complicated by history of COPD.  She reports no improvement in swelling with elevation.  She denies any significant claudication - No orthopnea or PND.  Weight has been stable - Started on furosemide  by PCP on 10/28 and spironolactone on 11/17 - Symptoms seem to not improve on furosemide  and she has yet to start spironolactone but will pick up medication today - Likely diastolic HF exacerbation - Plan for echocardiogram to reassess her LV function scheduled for 12/30 - Plan for BNP  today and can increase furosemide if needed - Continue furosemide 20 mg daily and spironolactone 12.5 daily  Coronary calcification on CT CTA 12/2021 showed severe burden of multivessel coronary atherosclerosis - Today she is stable without chest pains.  She is without symptoms concerning for active angina - EKG today without acute ischemic changes - Continue aspirin  81 mg daily, clopidogrel  75 mg daily, and rosuvastatin 20 mg daily  History of syncope No clear etiology ZIO 10/2021 without AV block or bradycardia Carotid Doppler 10/2021 without stenosis Echocardiogram 10/2021 with normal LVEF, no RWMA and no significant valvular abnormalities - Today she is without further episodes of syncope - Continue to clinically monitor  Thoracic aortic aneurysm CTA aorta 12/2021 showed stable 4.7 cm dilation of the ascending thoracic aorta - Will plan for repeat CTA aorta for routine monitoring  Aortic insufficiency Echocardiogram 10/2021 mild aortic regurgitation - Planning for repeat  echocardiogram for routine monitoring  Peripheral arterial disease S/p aortogram with right external iliac artery stenting on 02/2022 due to right leg claudication - Reports mild claudication symptoms - Managed by vascular surgery.  I will have her schedule follow-up visit - Managed on aspirin , clopidogrel , and pravastatin        Dispo:  Return in about 2 months (around 06/09/2024).  Signed, Lum LITTIE Louis, NP

## 2024-04-09 NOTE — Patient Instructions (Addendum)
 Medication Instructions:  CONTINUE TO TAKE LASIX 20 MG DAILY.  Lab Work: BMET, BNP, AND CMET TO BE DONE TODAY.  Testing/Procedures: Non-Cardiac CT Angiography (CTA), is a special type of CT scan that uses a computer to produce multi-dimensional views of major blood vessels throughout the body. In CT angiography, a contrast material is injected through an IV to help visualize the blood vessels   Follow-Up: At [facility], you and your health needs are our priority.  As part of our continuing mission to provide you with exceptional heart care, our providers are all part of one team.  This team includes your primary Cardiologist (physician) and Advanced Practice Providers or APPs (Physician Assistants and Nurse Practitioners) who all work together to provide you with the care you need, when you need it.  Your next appointment:   2ND WEEK OF JANUARY 2026  Provider:   Lonni Cash, MD

## 2024-04-09 NOTE — Progress Notes (Deleted)
 EKG

## 2024-04-11 LAB — COMPREHENSIVE METABOLIC PANEL WITH GFR
ALT: 8 IU/L (ref 0–32)
AST: 22 IU/L (ref 0–40)
Albumin: 4.5 g/dL (ref 3.7–4.7)
Alkaline Phosphatase: 60 IU/L (ref 48–129)
BUN/Creatinine Ratio: 18 (ref 12–28)
BUN: 14 mg/dL (ref 8–27)
Bilirubin Total: 0.4 mg/dL (ref 0.0–1.2)
CO2: 26 mmol/L (ref 20–29)
Calcium: 10.3 mg/dL (ref 8.7–10.3)
Chloride: 100 mmol/L (ref 96–106)
Creatinine, Ser: 0.78 mg/dL (ref 0.57–1.00)
Globulin, Total: 2.4 g/dL (ref 1.5–4.5)
Glucose: 88 mg/dL (ref 70–99)
Potassium: 4.1 mmol/L (ref 3.5–5.2)
Sodium: 139 mmol/L (ref 134–144)
Total Protein: 6.9 g/dL (ref 6.0–8.5)
eGFR: 74 mL/min/1.73 (ref >59)

## 2024-04-11 LAB — BRAIN NATRIURETIC PEPTIDE: BNP: 69.3 pg/mL (ref 0.0–100.0)

## 2024-04-12 ENCOUNTER — Ambulatory Visit
Admission: RE | Admit: 2024-04-12 | Discharge: 2024-04-12 | Disposition: A | Source: Ambulatory Visit | Attending: Family Medicine | Admitting: Family Medicine

## 2024-04-12 DIAGNOSIS — M542 Cervicalgia: Secondary | ICD-10-CM

## 2024-04-12 DIAGNOSIS — M4312 Spondylolisthesis, cervical region: Secondary | ICD-10-CM | POA: Diagnosis not present

## 2024-04-13 ENCOUNTER — Ambulatory Visit: Payer: Self-pay | Admitting: Family Medicine

## 2024-04-14 ENCOUNTER — Ambulatory Visit: Payer: Self-pay | Admitting: Emergency Medicine

## 2024-04-28 ENCOUNTER — Ambulatory Visit (HOSPITAL_COMMUNITY)

## 2024-05-13 ENCOUNTER — Ambulatory Visit: Admitting: Cardiology

## 2024-05-19 ENCOUNTER — Ambulatory Visit (HOSPITAL_COMMUNITY)
Admission: RE | Admit: 2024-05-19 | Discharge: 2024-05-19 | Disposition: A | Discharge status: Home / Self Care | Source: Ambulatory Visit | Attending: Family Medicine | Admitting: Family Medicine

## 2024-05-19 ENCOUNTER — Other Ambulatory Visit: Payer: Self-pay | Admitting: Family Medicine

## 2024-05-19 ENCOUNTER — Ambulatory Visit (HOSPITAL_BASED_OUTPATIENT_CLINIC_OR_DEPARTMENT_OTHER)
Admission: RE | Admit: 2024-05-19 | Discharge: 2024-05-19 | Disposition: A | Source: Ambulatory Visit | Attending: Family Medicine | Admitting: Family Medicine

## 2024-05-19 DIAGNOSIS — R079 Chest pain, unspecified: Secondary | ICD-10-CM | POA: Insufficient documentation

## 2024-05-19 DIAGNOSIS — I739 Peripheral vascular disease, unspecified: Secondary | ICD-10-CM | POA: Diagnosis not present

## 2024-05-19 LAB — VAS US ABI WITH/WO TBI
Left ABI: 0.58
Right ABI: 0.74

## 2024-05-20 ENCOUNTER — Ambulatory Visit (HOSPITAL_COMMUNITY): Admission: RE | Admit: 2024-05-20 | Source: Ambulatory Visit

## 2024-05-20 ENCOUNTER — Ambulatory Visit (HOSPITAL_BASED_OUTPATIENT_CLINIC_OR_DEPARTMENT_OTHER)
Admission: RE | Admit: 2024-05-20 | Discharge: 2024-05-20 | Disposition: A | Discharge status: Home / Self Care | Source: Ambulatory Visit | Attending: Family Medicine | Admitting: Family Medicine

## 2024-05-20 DIAGNOSIS — R079 Chest pain, unspecified: Secondary | ICD-10-CM | POA: Diagnosis not present

## 2024-05-20 DIAGNOSIS — I739 Peripheral vascular disease, unspecified: Secondary | ICD-10-CM

## 2024-05-20 DIAGNOSIS — M542 Cervicalgia: Secondary | ICD-10-CM

## 2024-05-20 NOTE — Progress Notes (Signed)
Ordered epidural.

## 2024-05-20 NOTE — Telephone Encounter (Signed)
 Patient would like to proceed with the epidural injection. Can this be ordered for her?

## 2024-05-21 LAB — ECHOCARDIOGRAM COMPLETE
Area-P 1/2: 3.31 cm2
P 1/2 time: 417 ms
S' Lateral: 2.7 cm

## 2024-05-27 ENCOUNTER — Ambulatory Visit: Admitting: Podiatry

## 2024-05-27 ENCOUNTER — Encounter: Payer: Self-pay | Admitting: Podiatry

## 2024-05-27 DIAGNOSIS — L600 Ingrowing nail: Secondary | ICD-10-CM | POA: Diagnosis not present

## 2024-05-27 DIAGNOSIS — R55 Syncope and collapse: Secondary | ICD-10-CM | POA: Insufficient documentation

## 2024-05-27 DIAGNOSIS — H9190 Unspecified hearing loss, unspecified ear: Secondary | ICD-10-CM | POA: Insufficient documentation

## 2024-05-27 DIAGNOSIS — S92351A Displaced fracture of fifth metatarsal bone, right foot, initial encounter for closed fracture: Secondary | ICD-10-CM | POA: Insufficient documentation

## 2024-05-27 DIAGNOSIS — I700 Atherosclerosis of aorta: Secondary | ICD-10-CM | POA: Insufficient documentation

## 2024-05-27 DIAGNOSIS — C44719 Basal cell carcinoma of skin of left lower limb, including hip: Secondary | ICD-10-CM | POA: Insufficient documentation

## 2024-05-27 DIAGNOSIS — Z85828 Personal history of other malignant neoplasm of skin: Secondary | ICD-10-CM | POA: Insufficient documentation

## 2024-05-27 DIAGNOSIS — D485 Neoplasm of uncertain behavior of skin: Secondary | ICD-10-CM | POA: Insufficient documentation

## 2024-05-27 DIAGNOSIS — W010XXA Fall on same level from slipping, tripping and stumbling without subsequent striking against object, initial encounter: Secondary | ICD-10-CM | POA: Insufficient documentation

## 2024-05-27 DIAGNOSIS — H5702 Anisocoria: Secondary | ICD-10-CM | POA: Insufficient documentation

## 2024-05-27 DIAGNOSIS — D099 Carcinoma in situ, unspecified: Secondary | ICD-10-CM | POA: Insufficient documentation

## 2024-05-27 DIAGNOSIS — M8440XA Pathological fracture, unspecified site, initial encounter for fracture: Secondary | ICD-10-CM | POA: Insufficient documentation

## 2024-05-27 DIAGNOSIS — Z91048 Other nonmedicinal substance allergy status: Secondary | ICD-10-CM | POA: Insufficient documentation

## 2024-05-27 DIAGNOSIS — R234 Changes in skin texture: Secondary | ICD-10-CM | POA: Insufficient documentation

## 2024-05-27 DIAGNOSIS — K219 Gastro-esophageal reflux disease without esophagitis: Secondary | ICD-10-CM | POA: Insufficient documentation

## 2024-05-27 DIAGNOSIS — E871 Hypo-osmolality and hyponatremia: Secondary | ICD-10-CM | POA: Insufficient documentation

## 2024-05-27 DIAGNOSIS — L97519 Non-pressure chronic ulcer of other part of right foot with unspecified severity: Secondary | ICD-10-CM | POA: Insufficient documentation

## 2024-05-27 MED ORDER — NEOMYCIN-POLYMYXIN-HC 1 % OT SOLN: OTIC | 1 refills | Status: AC

## 2024-05-27 NOTE — Progress Notes (Signed)
 She presents today chief complaint of ingrown toenail fibular border of the hallux right.  She states that it pushes against the second toe and it seems to be painful particularly right here she points to the fibular border of the hallux right.  Objective: Vital signs are stable alert oriented x 3.  Pulses are palpable.  Sharply abraded nail margin along fibular border of the hallux right with mild hallux abductovalgus deformity resulting in his juxtaposition and irritation of the lateral nail fold and nail plate.  Assessment: Ingrown toenail fibular border hallux right.  Plan: Chemical matricectomy was performed today after local anesthetic was administered she tolerated procedure well.  Surgicel was also applied.  I was the nail split from distal proximal avulsed exposing the matrix 3 applications of phenol were applied Surgicel was placed after alcohol  was used for neutralization.  Silvadene cream Telfa pad addressed a compressive dressing was applied.  She was given both oral and written home-going instructions for the care and soaking of the toe as well as a prescription for Cortisporin Otic to be applied twice daily after soaking.  I like to follow-up with her in about 2 weeks to make sure she is doing well.

## 2024-05-27 NOTE — Patient Instructions (Signed)

## 2024-06-05 NOTE — Progress Notes (Signed)
 " Christie Peters [address] [place] [address] Phone: [phone] Subjective:   LILLETTE Berwyn Peters, am serving as a scribe for Dr. Arthea Claudene.  I'm seeing this patient by the request  of:  [name], MD  CC: Neck pain  YEP:Dlagzrupcz  04/08/2024 Known arthritic changes, severe overall.  We discussed with patient secondary to the severity of the arthritic changes and patient having radicular symptoms and weakness I do think that we need to consider the possibility of advanced imaging and see if patient is a candidate for the possibility of epidurals.  Patient is in agreement of the plan.  Will order these imaging stat for further evaluation and treatment options.  We discussed medications but secondary to patient's age and difficulty already with walking did not want to use any medications that could contribute to this.  Follow-up again in 6 to 8 weeks.     Patient does have a history of stenting noted.  I am concerned that there is a worsening blood flow to the toes at the moment.  I do not get a good dorsalis pedis pulse.  Repeat ABI and aortic imaging.  Referred patient back to her vascular surgeon.  Discussed with patient about icing regimen and home exercises otherwise.  We discussed with patient the potential compression and keeping the limbs warm.  Follow-up again in 6 to 8 weeks     Updated 06/10/2024 Christie Peters is a 89 y.o. female coming in with complaint of back pain. Patient states that her back is doing better. Has more pain in her neck. Was recommended that she get an epidural.   MRI of the cervical spine shows the patient has multiple areas of nerve root and her foraminal stenosis noted.  Patient does have a broad-based disc osteophyte complex that does seem to be consistent with her findings and symptomatic at C6-C7.  Ventral cord distortion noted.     Past Medical History:  Diagnosis Date   Dental crowns present     Dupuytren's contracture of left hand 09/2014   left middle finger   Hyperlipidemia    Hypertension    states under control with meds., has been on med. x 2 yr.   Past Surgical History:  Procedure Laterality Date   ABDOMINAL AORTOGRAM W/LOWER EXTREMITY N/A 03/08/2022   Procedure: ABDOMINAL AORTOGRAM W/LOWER EXTREMITY;  Surgeon: Lanis Fonda BRAVO, MD;  [facility];  Service: Cardiovascular;  Laterality: N/A;   BLEPHAROPLASTY Bilateral 02/17/2013   upper lid   CATARACT EXTRACTION W/ INTRAOCULAR LENS  IMPLANT, BILATERAL     CESAREAN SECTION     COLONOSCOPY WITH PROPOFOL   03/21/2013   FASCIOTOMY Left 10/01/2014   Procedure: FASCIOTOMY LEFT MIDDLE FINGER, FASCIOTOMY LEFT RING FINGER,  FASCIOTOMY LEFT SMALL FINGER ;  Surgeon: Arley Curia, MD;  Location: [facility];  Service: Orthopedics;  Laterality: Left;   PERIPHERAL VASCULAR INTERVENTION Right 03/08/2022   Procedure: PERIPHERAL VASCULAR INTERVENTION;  Surgeon: Lanis Fonda BRAVO, MD;  Location: [facility] INVASIVE CV LAB;  Service: Cardiovascular;  Laterality: Right;   Social History   Socioeconomic History   Marital status: Married    Spouse name: Not on file   Number of children: 3   Years of education: Not on file   Highest education level: Not on file  Occupational History   Occupation: retired   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  Tobacco Use   Smoking status: Former    Current  packs/day: 0.00    Average packs/day: 0.5 packs/day for 60.0 years (30.0 ttl pk-yrs)    Types: Cigarettes    Start date: 09/18/1953    Quit date: 09/18/2013    Years since quitting: 10.7   Smokeless tobacco: Never   Tobacco comments:    pt smoked off and on for about 60 years, reports stopping several times and then starting back  Vaping Use   Vaping status: Never Used  Substance and Sexual Activity   Alcohol  use: Yes    Comment: occasionally   Drug use: No   Sexual activity: Not on file  Other Topics Concern   Not on file   Social History Narrative   4 children, one is decreased   Original from [place]   Exercise- no   Diet- she is careful w/ diet   Will spend 2 months in [place]  this winter    Social Drivers of Health   Tobacco Use: Medium Risk (05/27/2024)   Patient History    Smoking Tobacco Use: Former    Smokeless Tobacco Use: Never    Passive Exposure: Not on Actuary Strain: Not on file  Food Insecurity: Not on file  Transportation Needs: Not on file  Physical Activity: Not on file  Stress: Not on file  Social Connections: Not on file  Depression (PHQ2-9): Not on file  Alcohol  Screen: Not on file  Housing: Not on file  Utilities: Not on file  Health Literacy: Not on file   Allergies[1] Family History  Problem Relation Age of Onset   Stroke Mother    Hypertension Father    Breast cancer Sister    Birth defects Sister    Diabetes Brother    Heart disease Brother        CABG    Current Outpatient Medications (Endocrine & Metabolic):    calcitonin, salmon, (MIACALCIN/FORTICAL) 200 UNIT/ACT nasal spray, Place 1 spray into alternate nostrils daily. Take 2 tums with it   denosumab  (PROLIA ) 60 MG/ML SOSY injection, Inject 60 mg into the skin every 6 (six) months.  Current Outpatient Medications (Cardiovascular):    ALDACTONE 25 MG tablet, Take 12.5 mg by mouth once.   benazepril  (LOTENSIN ) 20 MG tablet, Take 20 mg by mouth 2 (two) times daily.   furosemide  (LASIX ) 20 MG tablet, Take 1 tablet (20 mg total) by mouth daily.   rosuvastatin (CRESTOR) 20 MG tablet, Take 20 mg by mouth daily.  Current Outpatient Medications (Respiratory):    Fluticasone-Umeclidin-Vilant (TRELEGY ELLIPTA IN), Inhale 1 puff into the lungs daily.  Current Outpatient Medications (Analgesics):    aspirin  EC 81 MG tablet, Take 1 tablet (81 mg total) by mouth daily. Swallow whole.  Current Outpatient Medications (Hematological):    cilostazol (PLETAL) 50 MG tablet, SMARTSIG:1 Tablet(s) By Mouth  Morning-Evening   clopidogrel  (PLAVIX ) 75 MG tablet, 1 tablet Orally Once a day  Current Outpatient Medications (Other):    Calcium Carbonate-Vitamin D  (CALTRATE 600+D PO), Take by mouth.   Cholecalciferol 50 MCG (2000 UT) CAPS, 1 capsule.   citalopram (CELEXA) 10 MG tablet, Take 10 mg by mouth daily.   linaclotide (LINZESS) 145 MCG CAPS capsule, Take 145 mcg by mouth daily before breakfast.   NEOMYCIN -POLYMYXIN-HYDROCORTISONE (CORTISPORIN) 1 % SOLN OTIC solution, Apply 1-2 drops to toe BID after soaking   pantoprazole (PROTONIX) 40 MG tablet, Take 40 mg by mouth daily.   polyethylene glycol powder (GLYCOLAX/MIRALAX) 17 GM/SCOOP powder, Take 17 g by mouth daily.   Propylene Glycol 0.95 %  SOLN, Place 1 drop into both eyes daily.   Reviewed prior external information including notes and imaging from  primary care provider As well as notes that were available from care everywhere and other healthcare systems.  Past medical history, social, surgical and family history all reviewed in electronic medical record.  No pertanent information unless stated regarding to the chief complaint.   Review of Systems:  No headache, visual changes, nausea, vomiting, diarrhea, constipation, dizziness, abdominal pain, skin rash, fevers, chills, night sweats, weight loss, swollen lymph nodes, body aches, joint swelling, chest pain, shortness of breath, mood changes. POSITIVE muscle aches  Objective  Blood pressure 120/78, pulse 84, height 5' 6.5 (1.689 m), weight 134 lb (60.8 kg), SpO2 94%.   General: No apparent distress alert and oriented x3 mood and affect normal, dressed appropriately.  HEENT: Pupils equal, extraocular movements intact  Respiratory: Patient's speak in full sentences and does not appear short of breath  Cardiovascular: No lower extremity edema, non tender, no erythema  Patient does have arthritic changes of multiple joints.  Patient does have significant limitation in extension of the  neck having difficulty even getting back to a neutral position.  Tightness noted.  Patient does have some mild weakness with grip strength but seems to be symmetric.    Impression and Recommendations:     The above documentation has been reviewed and is accurate and complete Arthea CHRISTELLA Sharps, DO       [1]  Allergies Allergen Reactions   Levofloxacin  Itching and Rash   Ciprofloxacin Hcl     Other Reaction(s): avoid use with thoracic aneurysm   "

## 2024-06-10 ENCOUNTER — Ambulatory Visit: Admitting: Family Medicine

## 2024-06-10 VITALS — BP 120/78 | HR 84 | Ht 66.5 in | Wt 134.0 lb

## 2024-06-10 DIAGNOSIS — M5030 Other cervical disc degeneration, unspecified cervical region: Secondary | ICD-10-CM

## 2024-06-10 DIAGNOSIS — M5412 Radiculopathy, cervical region: Secondary | ICD-10-CM | POA: Diagnosis not present

## 2024-06-10 NOTE — Assessment & Plan Note (Addendum)
 Severe arthritic changes noted.  No affecting daily activities, waking patient up at night.  Patient's MRI did show significant areas where there is a possibility of nerve root impingement.  Discussed with patient about icing regimen and home exercises, increase activity slowly.  Discussed with patient what to expect with the injection.  Follow-up with me again in 6 to 8 weeks after the injection to see how patient responds.  I personally spent a total of 32 minutes in the care of the patient today including preparing to see the patient, getting/reviewing separately obtained history, counseling and educating, placing orders, documenting clinical information in the EHR, independently interpreting results, communicating results, and coordinating care.

## 2024-06-10 NOTE — Patient Instructions (Signed)
 [phone] to schedule for epidural See me again 8 weeks after injection

## 2024-06-11 ENCOUNTER — Ambulatory Visit

## 2024-06-17 ENCOUNTER — Other Ambulatory Visit

## 2024-06-17 ENCOUNTER — Ambulatory Visit

## 2024-06-19 NOTE — Discharge Instructions (Signed)

## 2024-06-20 ENCOUNTER — Ambulatory Visit
Admission: RE | Admit: 2024-06-20 | Discharge: 2024-06-20 | Disposition: A | Source: Ambulatory Visit | Attending: Family Medicine | Admitting: Family Medicine

## 2024-06-20 DIAGNOSIS — M5412 Radiculopathy, cervical region: Secondary | ICD-10-CM

## 2024-06-20 MED ORDER — IOPAMIDOL (ISOVUE-M 300) INJECTION 61%
1.0000 mL | Freq: Once | INTRAMUSCULAR | Status: AC | PRN
  Administered 2024-06-20: 1 mL via EPIDURAL

## 2024-06-20 MED ORDER — TRIAMCINOLONE ACETONIDE 40 MG/ML IJ SUSP (RADIOLOGY)
60.0000 mg | Freq: Once | INTRAMUSCULAR | Status: AC
  Administered 2024-06-20: 60 mg via EPIDURAL

## 2024-06-24 ENCOUNTER — Encounter: Payer: Self-pay | Admitting: Podiatry

## 2024-06-24 ENCOUNTER — Ambulatory Visit: Admitting: Podiatry

## 2024-06-24 DIAGNOSIS — M2041 Other hammer toe(s) (acquired), right foot: Secondary | ICD-10-CM

## 2024-06-24 DIAGNOSIS — L600 Ingrowing nail: Secondary | ICD-10-CM

## 2024-06-25 ENCOUNTER — Ambulatory Visit: Admitting: Cardiovascular Disease

## 2024-06-25 VITALS — BP 118/72 | HR 82 | Ht 66.0 in | Wt 136.0 lb

## 2024-06-25 DIAGNOSIS — I351 Nonrheumatic aortic (valve) insufficiency: Secondary | ICD-10-CM

## 2024-06-25 DIAGNOSIS — I2510 Atherosclerotic heart disease of native coronary artery without angina pectoris: Secondary | ICD-10-CM | POA: Diagnosis not present

## 2024-06-25 DIAGNOSIS — I7121 Aneurysm of the ascending aorta, without rupture: Secondary | ICD-10-CM | POA: Diagnosis not present

## 2024-06-25 DIAGNOSIS — I739 Peripheral vascular disease, unspecified: Secondary | ICD-10-CM | POA: Diagnosis not present

## 2024-06-25 NOTE — Progress Notes (Signed)
 She presents today for a nail check hallux right lateral border.  She states that I think is doing okay the left second toe is really starting to hurt.  She states that I have a hammertoe and is hurting him here as she points to the distal aspect of the toe itself.  Objective: Vital signs stable alert and oriented x 3 pulses remain palpable the lateral border of the first toe is healing well however on examination of the second toe she does have rigid hammertoe deformity at the level of the PIPJ.  She also has some mild erythema to the distal medial aspect of the nail margin.  This is exquisitely painful on palpation to her.  Assessment: Well-healing surgical toe hallux right lateral border ingrown nail hammertoe deformity second digit right foot.  Most likely the hammertoe deformities resulting in the ingrown toenail.  Plan: After local anesthetic was administered I performed a chemical matricectomy.  Split the nail border from distal to proximal avulsing a fairly large section of nail that was down in the tissue.  3 applications of phenol were applied to kill the matrix.  Isopropyl alcohol  was applied Silvadene cream Telfa pad addressed a compressive dressing she will continue to soak the foot starting tomorrow which would be good for the hallux and the second digit and Cortisporin otic was prescribed once again to be applied twice daily after soaking.  Follow-up with her in about 2 weeks

## 2024-06-25 NOTE — Patient Instructions (Signed)
 Medication Instructions:  The current medical regimen is effective;  continue present plan and medications.  *If you need a refill on your cardiac medications before your next appointment, please call your pharmacy*  Testing/Procedures: Non-Cardiac CT Angiography (CTA), is a special type of CT scan that uses a computer to produce multi-dimensional views of major blood vessels throughout the body. In CT angiography, a contrast material is injected through an IV to help visualize the blood vessels These are completed on the 2nd floor here at [facility].  You will be contacted to be scheduled.  Follow-Up: At [facility], you and your health needs are our priority.  As part of our continuing mission to provide you with exceptional heart care, our providers are all part of one team.  This team includes your primary Cardiologist (physician) and Advanced Practice Providers or APPs (Physician Assistants and Nurse Practitioners) who all work together to provide you with the care you need, when you need it.  Your next appointment:   1 year(s)  Provider:   Lonni Cash, MD    We recommend signing up for the patient portal called MyChart.  Sign up information is provided on this After Visit Summary.  MyChart is used to connect with patients for Virtual Visits (Telemedicine).  Patients are able to view lab/test results, encounter notes, upcoming appointments, etc.  Non-urgent messages can be sent to your provider as well.   To learn more about what you can do with MyChart, go to [email or web address].

## 2024-06-25 NOTE — Progress Notes (Signed)
 "   Chief Complaint  Patient presents with   Follow-up    CAD   History of Present Illness: 89 yo female with history of CAD (calcification noted on chest CT), HTN, COPD, PAD, thoracic aortic aneurysm and hyperlipidemia who is here today for cardiac follow up. I saw her as a new patient for the evaluation of syncope in June 2023. She passed out in the bathroom in April 2023. She had decreased po intake leading up to that secondary to constipation. She was seen in primary care in May 2023 and had a normal EKG, normal BP. Brain MRI May 2023 with no acute abnormality. There was chronic small vessel ischemia and cerebral volume loss. She has COPD and is on nocturnal supplemental O2 therapy. Cardiac monitor June 2023 with PACs, PVCs and brief NSVT. NO AV block or pauses/bradycardia noted. Echo 11/09/21 with LVEF=60-65%, moderate basal septal hypertrophy, trivial MR. Aortic valve sclerosis without stenosis, mild AI. Mild dilation of the aortic root. Chest CTA August 2023 with 4.7 cm ascending aorta. Coronary artery calcification noted. Carotid artery dopplers June 2023 with no significant disease. She has PAD and is followed in [facility] by Dr. Lanis. Right external iliac artery stenting in 2023. She was seen in our office in November 2025 with c/o LE edema and dyspnea. Echo December 2025 with LVEF=55-60%. Moderate asymmetric hypertrophy of the basal septum. Mild AI. She was started on Lasix .   She is here today for follow up. The patient denies any chest pain, dyspnea, palpitations, lower extremity edema, orthopnea, PND, dizziness, near syncope or syncope.   Primary Care Physician: [name], MD   Past Medical History:  Diagnosis Date   Dental crowns present    Dupuytren's contracture of left hand 09/2014   left middle finger   Hyperlipidemia    Hypertension    states under control with meds., has been on med. x 2 yr.    Past Surgical History:  Procedure Laterality Date   ABDOMINAL  AORTOGRAM W/LOWER EXTREMITY N/A 03/08/2022   Procedure: ABDOMINAL AORTOGRAM W/LOWER EXTREMITY;  Surgeon: Lanis Fonda BRAVO, MD;  Location: [facility] INVASIVE CV LAB;  Service: Cardiovascular;  Laterality: N/A;   BLEPHAROPLASTY Bilateral 02/17/2013   upper lid   CATARACT EXTRACTION W/ INTRAOCULAR LENS  IMPLANT, BILATERAL     CESAREAN SECTION     COLONOSCOPY WITH PROPOFOL   03/21/2013   FASCIOTOMY Left 10/01/2014   Procedure: FASCIOTOMY LEFT MIDDLE FINGER, FASCIOTOMY LEFT RING FINGER,  FASCIOTOMY LEFT SMALL FINGER ;  Surgeon: Arley Curia, MD;  Location: [facility];  Service: Orthopedics;  Laterality: Left;   PERIPHERAL VASCULAR INTERVENTION Right 03/08/2022   Procedure: PERIPHERAL VASCULAR INTERVENTION;  Surgeon: Lanis Fonda BRAVO, MD;  Location: [facility] INVASIVE CV LAB;  Service: Cardiovascular;  Laterality: Right;    Current Outpatient Medications  Medication Sig Dispense Refill   ALDACTONE 25 MG tablet Take 12.5 mg by mouth once.     aspirin  EC 81 MG tablet Take 1 tablet (81 mg total) by mouth daily. Swallow whole. 90 tablet 3   benazepril  (LOTENSIN ) 20 MG tablet Take 20 mg by mouth 2 (two) times daily.     calcitonin, salmon, (MIACALCIN/FORTICAL) 200 UNIT/ACT nasal spray Place 1 spray into alternate nostrils daily. Take 2 tums with it 3.7 mL 0   Calcium Carbonate-Vitamin D  (CALTRATE 600+D PO) Take by mouth.     Cholecalciferol 50 MCG (2000 UT) CAPS 1 capsule.     citalopram (CELEXA) 10 MG tablet Take 10  mg by mouth daily.     denosumab  (PROLIA ) 60 MG/ML SOSY injection Inject 60 mg into the skin every 6 (six) months.     Fluticasone-Umeclidin-Vilant (TRELEGY ELLIPTA IN) Inhale 1 puff into the lungs daily.     NEOMYCIN -POLYMYXIN-HYDROCORTISONE (CORTISPORIN) 1 % SOLN OTIC solution Apply 1-2 drops to toe BID after soaking 10 mL 1   polyethylene glycol powder (GLYCOLAX/MIRALAX) 17 GM/SCOOP powder Take 17 g by mouth daily.     Propylene Glycol 0.95 % SOLN Place 1 drop into both eyes daily.      rosuvastatin (CRESTOR) 20 MG tablet Take 20 mg by mouth daily.     cilostazol (PLETAL) 50 MG tablet SMARTSIG:1 Tablet(s) By Mouth Morning-Evening (Patient not taking: Reported on 06/25/2024)     clopidogrel  (PLAVIX ) 75 MG tablet 1 tablet Orally Once a day (Patient not taking: Reported on 06/25/2024)     furosemide  (LASIX ) 20 MG tablet Take 1 tablet (20 mg total) by mouth daily. (Patient not taking: Reported on 06/25/2024) 90 tablet 3   linaclotide (LINZESS) 145 MCG CAPS capsule Take 145 mcg by mouth daily before breakfast. (Patient not taking: Reported on 06/25/2024)     pantoprazole (PROTONIX) 40 MG tablet Take 40 mg by mouth daily. (Patient not taking: Reported on 06/25/2024)     No current facility-administered medications for this visit.    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  Tobacco Use   Smoking status: Former    Current packs/day: 0.00    Average packs/day: 0.5 packs/day for 60.0 years (30.0 ttl pk-yrs)    Types: Cigarettes    Start date: 09/18/1953    Quit date: 09/18/2013    Years since quitting: 10.7   Smokeless tobacco: Never   Tobacco comments:    pt smoked off and on for about 60 years, reports stopping several times and then starting back  Vaping Use   Vaping status: Never Used  Substance and Sexual Activity   Alcohol  use: Yes    Comment: occasionally   Drug use: No   Sexual activity: Not on file  Other Topics Concern   Not on file  Social History Narrative   4 children, one is decreased   Original from [place]   Exercise- no   Diet- she is careful w/ diet   Will spend 2 months in  [place]  this winter    Social Drivers of Health   Tobacco Use: Medium Risk (06/24/2024)   Patient History    Smoking Tobacco Use: Former    Smokeless Tobacco Use: Never    Passive Exposure: Not on Actuary Strain: Not on file  Food Insecurity: Not on file  Transportation Needs: Not on file  Physical Activity: Not on file  Stress: Not on file  Social Connections: Not on file  Intimate Partner Violence: Not on file  Depression (EYV7-0): Not on file  Alcohol  Screen: Not on file  Housing: Not on file  Utilities: Not on file  Health Literacy: Not on file    Family History  Problem Relation Age  of Onset   Stroke Mother    Hypertension Father    Breast cancer Sister    Birth defects Sister    Diabetes Brother    Heart disease Brother        CABG    Review of Systems:  As stated in the HPI and otherwise negative.   BP 118/72 (BP Location: Left Arm, Patient Position: Sitting, Cuff Size: Normal)   Pulse 82   Ht 5' 6 (1.676 m)   Wt 136 lb (61.7 kg)   SpO2 98%   BMI 21.95 kg/m   Physical Examination:  General: Well developed, well nourished, NAD  SKIN: warm, dry. Neuro: No focal deficits  Psychiatric: Mood and affect normal  Neck: No JVD Lungs:Clear bilaterally, no wheezes, rhonci, crackles Cardiovascular: Regular rate and rhythm. No murmurs, gallops or rubs. Abdomen:Soft.  Extremities: No lower extremity edema.    EKG:  EKG is not ordered today. The ekg ordered today demonstrates   Recent Labs: 04/09/2024: ALT 8; BNP 69.3; BUN 14; Creatinine, Ser 0.78; Potassium 4.1; Sodium 139   Lipid Panel    Component Value Date/Time   CHOL 244 (H) 10/18/2010 0921   TRIG 50.0 10/18/2010 0921   TRIG 59 04/16/2006 1107   HDL 100.20 10/18/2010 0921   CHOLHDL 2 10/18/2010 0921   VLDL 10.0 10/18/2010 0921   LDLCALC 172 (H) 09/12/2007 0841   LDLDIRECT 152.2 10/18/2010 0921    Wt Readings from Last 3 Encounters:  06/25/24 136 lb (61.7 kg)  06/10/24 134 lb  (60.8 kg)  04/09/24 136 lb (61.7 kg)    Assessment and Plan:   1. CAD without angina: Coronary artery calcification noted on chest CTA. No chest pain.  -Continue ASA, Plavix  (following PV intervention) and Crestor  2. Thoracic aortic aneurysm: 4.7 cm by chest CTA in 2023.  -Will repeat chest CTA now  3. Aortic insufficiency: Mild by echo in December 2025.    4. PAD: Followed in Vascular surgery by Dr. Lanis  5. Hyperlipidemia: LDL near goal in 2025.  -Continue Crestor  Labs/ tests ordered today include:  Orders Placed This Encounter  Procedures   CT ANGIO CHEST AORTA W/CM & OR WO/CM   Disposition:   F/U with me in 12 months.    Signed, Lonni Cash, MD 06/25/2024 4:31 PM    [facility] [address] [place], [place], [place]  [address] Phone: [phone]; Fax: [phone]   "

## 2024-07-04 ENCOUNTER — Ambulatory Visit (HOSPITAL_COMMUNITY)

## 2024-07-10 ENCOUNTER — Ambulatory Visit: Admitting: Podiatry

## 2024-07-16 ENCOUNTER — Ambulatory Visit
